# Patient Record
Sex: Female | Born: 1940 | Race: White | Hispanic: No | Smoking: Current every day smoker
Health system: Southern US, Community
[De-identification: ages and names within clinical notes are randomized; demographics above are authoritative.]

## PROBLEM LIST (undated history)

## (undated) DIAGNOSIS — C189 Malignant neoplasm of colon, unspecified: Secondary | ICD-10-CM

## (undated) DIAGNOSIS — E079 Disorder of thyroid, unspecified: Secondary | ICD-10-CM

## (undated) DIAGNOSIS — I10 Essential (primary) hypertension: Secondary | ICD-10-CM

## (undated) HISTORY — PX: COLON SURGERY: SHX602

## (undated) HISTORY — PX: ABDOMINAL HYSTERECTOMY: SHX81

## (undated) HISTORY — PX: BRAIN SURGERY: SHX531

---

## 2002-02-03 ENCOUNTER — Encounter: Payer: Self-pay | Admitting: Orthopedic Surgery

## 2002-02-03 ENCOUNTER — Encounter: Admission: RE | Admit: 2002-02-03 | Discharge: 2002-02-03 | Payer: Self-pay | Admitting: Orthopedic Surgery

## 2008-02-04 ENCOUNTER — Emergency Department (HOSPITAL_COMMUNITY): Admission: EM | Admit: 2008-02-04 | Discharge: 2008-02-04 | Payer: Self-pay | Admitting: Emergency Medicine

## 2008-07-29 ENCOUNTER — Encounter: Admission: RE | Admit: 2008-07-29 | Discharge: 2008-07-29 | Payer: Self-pay | Admitting: Family Medicine

## 2008-08-30 ENCOUNTER — Encounter: Admission: RE | Admit: 2008-08-30 | Discharge: 2008-08-30 | Payer: Self-pay | Admitting: Gastroenterology

## 2008-10-07 ENCOUNTER — Encounter: Admission: RE | Admit: 2008-10-07 | Discharge: 2008-10-07 | Payer: Self-pay | Admitting: Gastroenterology

## 2009-04-05 ENCOUNTER — Emergency Department (HOSPITAL_COMMUNITY): Admission: EM | Admit: 2009-04-05 | Discharge: 2009-04-05 | Payer: Self-pay | Admitting: Emergency Medicine

## 2009-07-05 ENCOUNTER — Emergency Department (HOSPITAL_COMMUNITY): Admission: EM | Admit: 2009-07-05 | Discharge: 2009-07-05 | Payer: Self-pay | Admitting: Emergency Medicine

## 2009-07-10 ENCOUNTER — Encounter: Admission: RE | Admit: 2009-07-10 | Discharge: 2009-07-10 | Payer: Self-pay | Admitting: Gastroenterology

## 2009-07-21 ENCOUNTER — Ambulatory Visit (HOSPITAL_COMMUNITY): Admission: RE | Admit: 2009-07-21 | Discharge: 2009-07-21 | Payer: Self-pay | Admitting: Gastroenterology

## 2009-07-21 ENCOUNTER — Encounter (INDEPENDENT_AMBULATORY_CARE_PROVIDER_SITE_OTHER): Payer: Self-pay | Admitting: Gastroenterology

## 2010-05-07 ENCOUNTER — Inpatient Hospital Stay (HOSPITAL_COMMUNITY): Admission: EM | Admit: 2010-05-07 | Discharge: 2010-05-09 | Payer: Self-pay | Admitting: Emergency Medicine

## 2010-05-08 ENCOUNTER — Ambulatory Visit: Payer: Self-pay | Admitting: Psychiatry

## 2010-05-09 ENCOUNTER — Ambulatory Visit: Payer: Self-pay | Admitting: Psychiatry

## 2010-06-04 IMAGING — RF DG BE THRU COLOSTOMY
17 series · 17 of 17 positions shown · IV contrast (agent unspecified)
Comparison: CT 08/30/2008

CLINICAL DATA: Rectal cancer, with prior partial colectomy.
Question anatomy.

SINGLE CONTRAST BARIUM ENEMA
Fluoroscopy Time: 2.9 minutes.
Contrast: Thin barium.

[Series 1: run · 1 of 1 slices shown (1 of 16)]
[im 1/1]
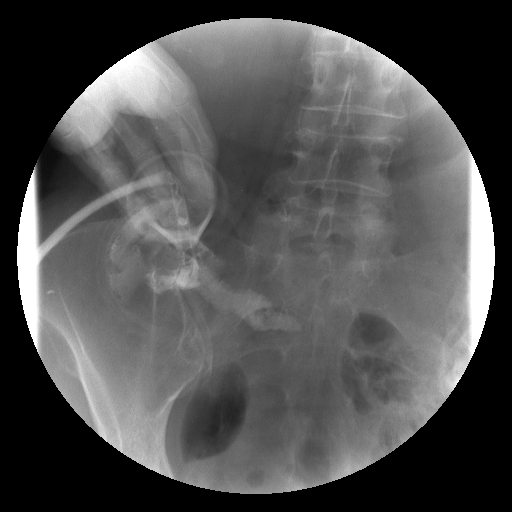

[Series 2: run · 1 of 1 slices shown (2 of 16)]
[im 1/1]
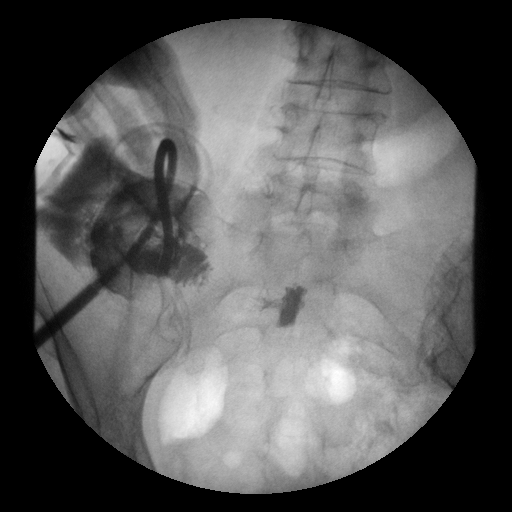

[Series 3: run · 1 of 1 slices shown (3 of 16)]
[im 1/1]
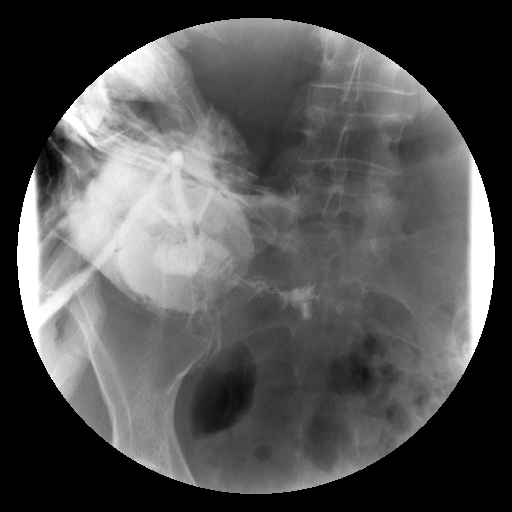

[Series 4: run · 1 of 1 slices shown (4 of 16)]
[im 1/1]
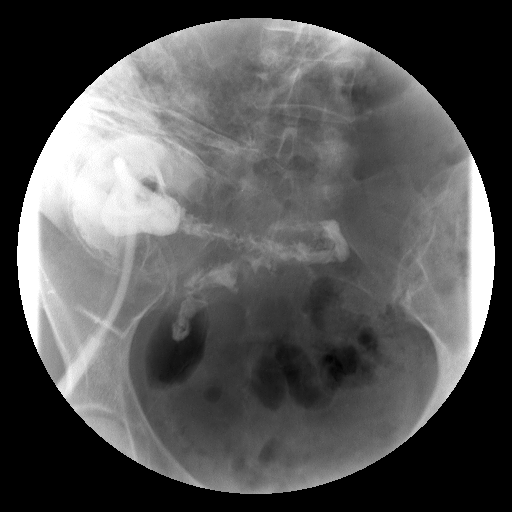

[Series 5: run · 1 of 1 slices shown (5 of 16)]
[im 1/1]
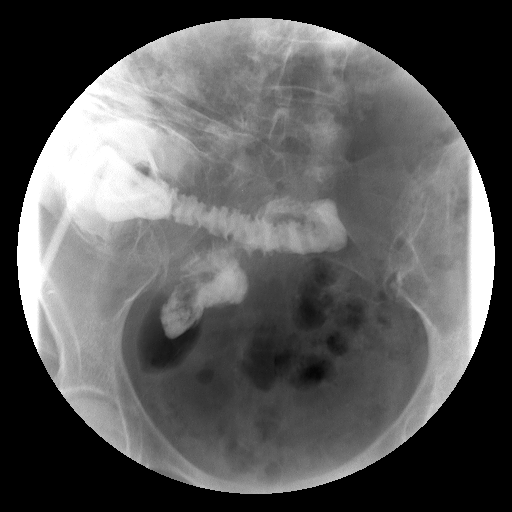

[Series 6: run · 1 of 1 slices shown (6 of 16)]
[im 1/1]
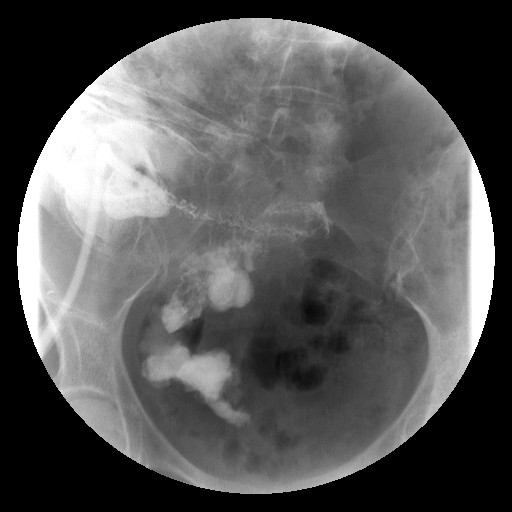

[Series 7: run · 1 of 1 slices shown (7 of 16)]
[im 1/1]
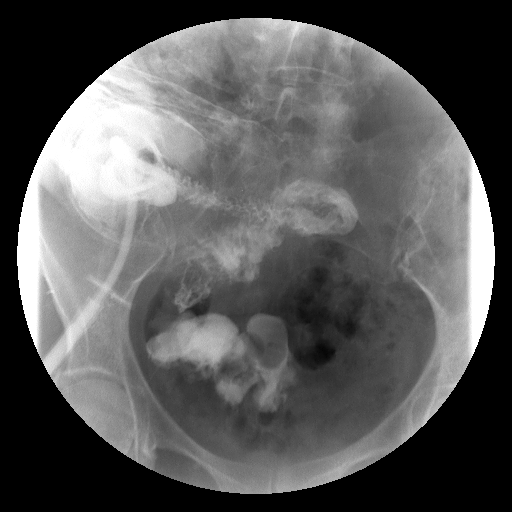

[Series 8: run · 1 of 1 slices shown (8 of 16)]
[im 1/1]
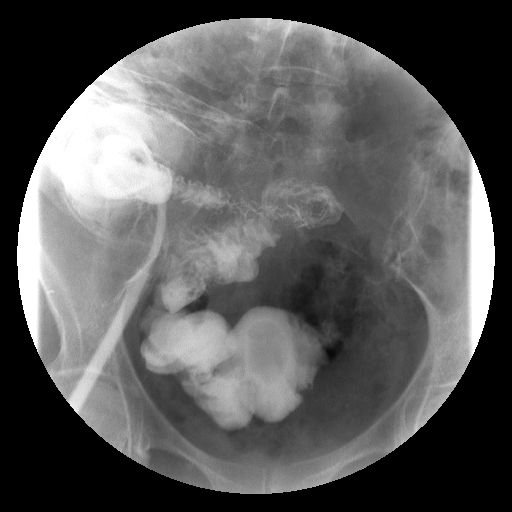

[Series 9: run · 1 of 1 slices shown (9 of 16)]
[im 1/1]
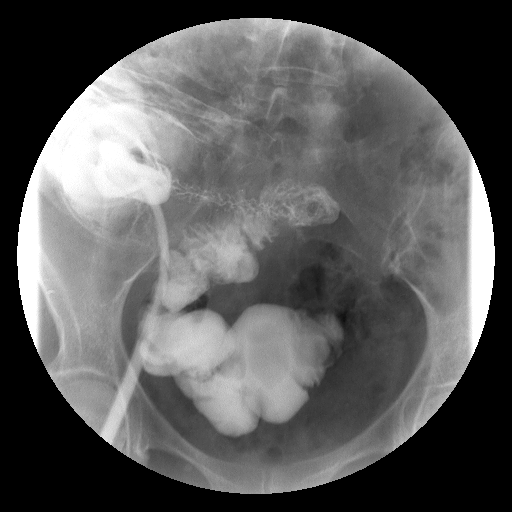

[Series 10: run · 1 of 1 slices shown (10 of 16)]
[im 1/1]
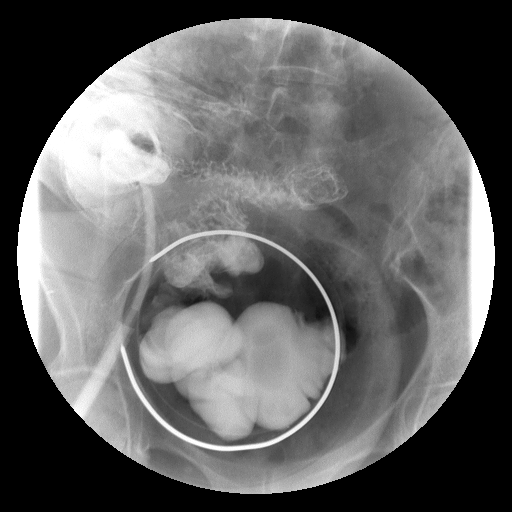

[Series 11: run · 1 of 1 slices shown (11 of 16)]
[im 1/1]
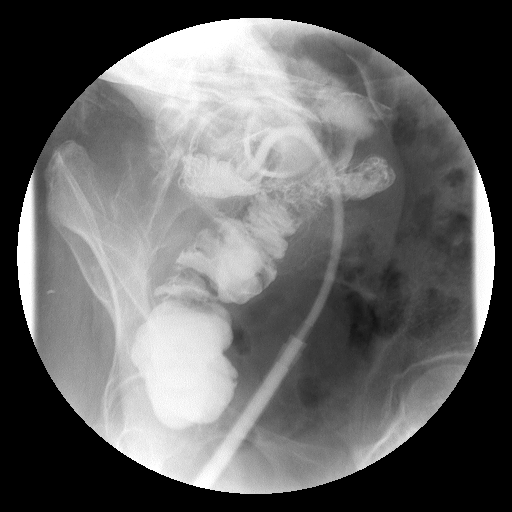

[Series 12: run · 1 of 1 slices shown (12 of 16)]
[im 1/1]
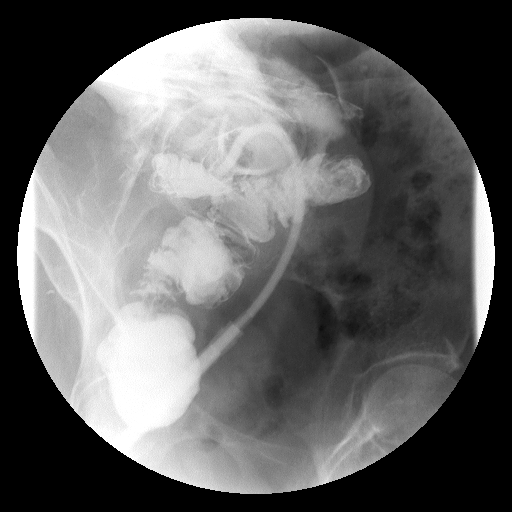

[Series 13: run · 1 of 1 slices shown (13 of 16)]
[im 1/1]
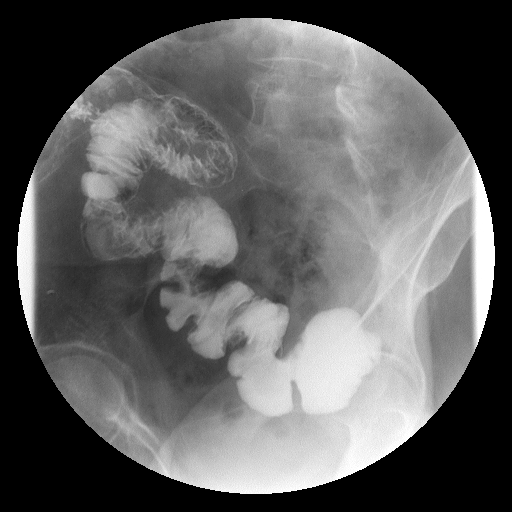

[Series 14: run · 1 of 1 slices shown (14 of 16)]
[im 1/1]
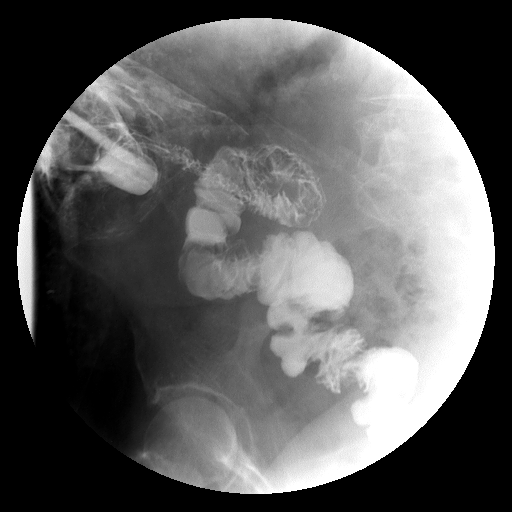

[Series 15: run · 1 of 1 slices shown (15 of 16)]
[im 1/1]
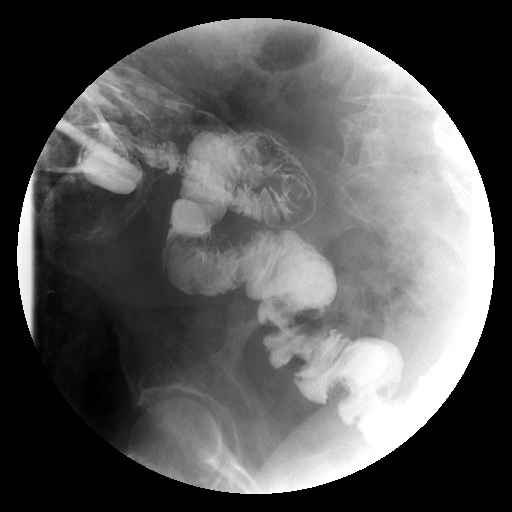

[Series 16: run · 1 of 1 slices shown (16 of 16)]
[im 1/1]
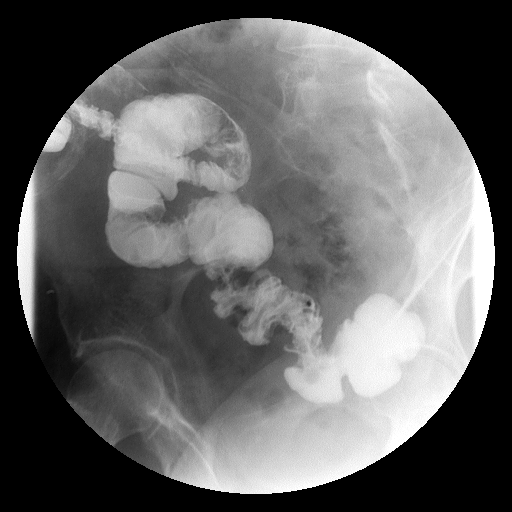

[Series 1001: view not recorded · 0.20mm/px · 1 of 1 slices shown]
[im 1/1]
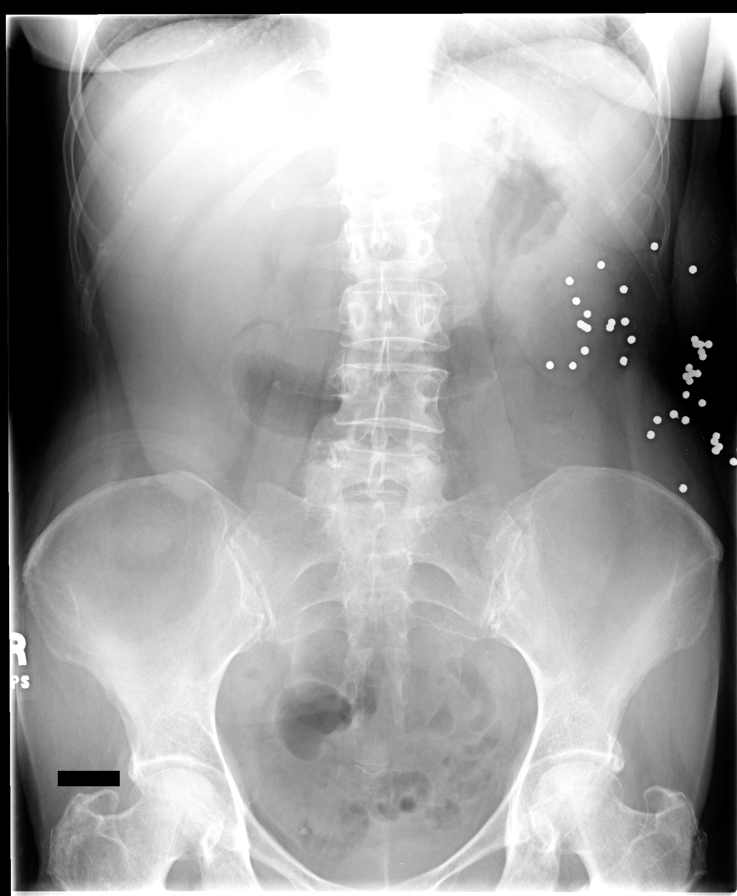

[17 of 17 positions shown; findings below may reference images not displayed]

FINDINGS: Single contrast barium enema was performed through the
patient's right lower quadrant colostomy.  Scout image of the
abdomen shows normal bowel gas pattern. Enema through the colostomy
shows filling of the right side of the colon.  Colostomy at the
level of the proximal to mid transverse colon.  There is acute
angulation of the colon just to the left of midline, swinging back
upon itself approximate 180 degrees.  No obvious filling defect or
mass.
IMPRESSION: Acute angulation of the colon to the left of the midline of
approximately 180 degrees.  No definite filling defect or mass.

## 2010-06-12 ENCOUNTER — Encounter: Admission: RE | Admit: 2010-06-12 | Discharge: 2010-06-12 | Payer: Self-pay | Admitting: Gastroenterology

## 2010-11-18 ENCOUNTER — Encounter: Payer: Self-pay | Admitting: Surgery

## 2011-01-13 LAB — DIFFERENTIAL
Basophils Relative: 1 % (ref 0–1)
Eosinophils Absolute: 0.1 10*3/uL (ref 0.0–0.7)
Eosinophils Relative: 1 % (ref 0–5)
Lymphocytes Relative: 20 % (ref 12–46)
Lymphs Abs: 1.5 10*3/uL (ref 0.7–4.0)
Monocytes Absolute: 0.2 10*3/uL (ref 0.1–1.0)
Monocytes Relative: 3 % (ref 3–12)
Neutro Abs: 5.5 10*3/uL (ref 1.7–7.7)
Neutro Abs: 5.6 10*3/uL (ref 1.7–7.7)
Neutrophils Relative %: 76 % (ref 43–77)

## 2011-01-13 LAB — RAPID URINE DRUG SCREEN, HOSP PERFORMED
Cocaine: NOT DETECTED
Opiates: POSITIVE — AB
Tetrahydrocannabinol: NOT DETECTED

## 2011-01-13 LAB — CBC
HCT: 37.3 % (ref 36.0–46.0)
Hemoglobin: 10.4 g/dL — ABNORMAL LOW (ref 12.0–15.0)
Hemoglobin: 11.8 g/dL — ABNORMAL LOW (ref 12.0–15.0)
Hemoglobin: 12.9 g/dL (ref 12.0–15.0)
MCH: 33.3 pg (ref 26.0–34.0)
MCHC: 34.9 g/dL (ref 30.0–36.0)
MCV: 95.5 fL (ref 78.0–100.0)
MCV: 95.8 fL (ref 78.0–100.0)
MCV: 96.4 fL (ref 78.0–100.0)
Platelets: 144 10*3/uL — ABNORMAL LOW (ref 150–400)
Platelets: 173 10*3/uL (ref 150–400)
Platelets: 191 10*3/uL (ref 150–400)
RBC: 3.13 MIL/uL — ABNORMAL LOW (ref 3.87–5.11)
RBC: 3.89 MIL/uL (ref 3.87–5.11)
RDW: 13.3 % (ref 11.5–15.5)
WBC: 5.9 10*3/uL (ref 4.0–10.5)

## 2011-01-13 LAB — METHYLMALONIC ACID, SERUM: Methylmalonic Acid, Quantitative: 186 nmol/L (ref 87–318)

## 2011-01-13 LAB — COMPREHENSIVE METABOLIC PANEL
ALT: 35 U/L (ref 0–35)
Albumin: 3.2 g/dL — ABNORMAL LOW (ref 3.5–5.2)
Albumin: 3.8 g/dL (ref 3.5–5.2)
Alkaline Phosphatase: 97 U/L (ref 39–117)
BUN: 12 mg/dL (ref 6–23)
BUN: 19 mg/dL (ref 6–23)
CO2: 26 mEq/L (ref 19–32)
Calcium: 8.2 mg/dL — ABNORMAL LOW (ref 8.4–10.5)
Calcium: 9.3 mg/dL (ref 8.4–10.5)
Chloride: 105 mEq/L (ref 96–112)
GFR calc Af Amer: >60 mL/min (ref >60)
GFR calc non Af Amer: >60 mL/min (ref >60)
Glucose, Bld: 86 mg/dL (ref 70–99)
Glucose, Bld: 95 mg/dL (ref 70–99)
Potassium: 3.6 mEq/L (ref 3.5–5.1)
Sodium: 138 mEq/L (ref 135–145)
Sodium: 139 mEq/L (ref 135–145)
Total Protein: 5.7 g/dL — ABNORMAL LOW (ref 6.0–8.3)
Total Protein: 6.9 g/dL (ref 6.0–8.3)

## 2011-01-13 LAB — CARDIAC PANEL(CRET KIN+CKTOT+MB+TROPI)
Relative Index: 1.8 (ref 0.0–2.5)
Relative Index: 2 (ref 0.0–2.5)
Relative Index: 2 (ref 0.0–2.5)
Total CK: 167 U/L (ref 7–177)
Troponin I: 0.02 ng/mL (ref 0.00–0.06)
Troponin I: 0.02 ng/mL (ref 0.00–0.06)
Troponin I: 0.02 ng/mL (ref 0.00–0.06)

## 2011-01-13 LAB — FERRITIN: Ferritin: 125 ng/mL (ref 10–291)

## 2011-01-13 LAB — HOMOCYSTEINE: Homocysteine: 4.8 umol/L (ref 4.0–15.4)

## 2011-01-13 LAB — ACETAMINOPHEN LEVEL
Acetaminophen (Tylenol), Serum: <10 ug/mL — ABNORMAL LOW (ref 10–30)
Acetaminophen (Tylenol), Serum: <10 ug/mL — ABNORMAL LOW (ref 10–30)

## 2011-01-13 LAB — INTRINSIC FACTOR ANTIBODIES: Intrinsic Factor: NEGATIVE

## 2011-01-13 LAB — IRON AND TIBC
Saturation Ratios: 32 % (ref 20–55)
TIBC: 255 ug/dL (ref 250–470)
UIBC: 173 ug/dL

## 2011-01-13 LAB — URINALYSIS, ROUTINE W REFLEX MICROSCOPIC
Bilirubin Urine: NEGATIVE
Glucose, UA: NEGATIVE mg/dL
Protein, ur: NEGATIVE mg/dL
Specific Gravity, Urine: 1.022 (ref 1.005–1.030)
Urobilinogen, UA: 0.2 mg/dL (ref 0.0–1.0)

## 2011-01-13 LAB — TSH: TSH: 1.639 u[IU]/mL (ref 0.350–4.500)

## 2011-01-13 LAB — PROTIME-INR
INR: 1.04 (ref 0.00–1.49)
Prothrombin Time: 13.5 seconds (ref 11.6–15.2)

## 2011-01-13 LAB — GLUCOSE, CAPILLARY: Glucose-Capillary: 83 mg/dL (ref 70–99)

## 2011-01-13 LAB — FOLATE: Folate: >20 ng/mL

## 2011-01-13 LAB — BASIC METABOLIC PANEL
Chloride: 115 mEq/L — ABNORMAL HIGH (ref 96–112)
Creatinine, Ser: 0.67 mg/dL (ref 0.4–1.2)
GFR calc Af Amer: >60 mL/min (ref >60)
Sodium: 140 mEq/L (ref 135–145)

## 2011-01-13 LAB — POCT CARDIAC MARKERS: Myoglobin, poc: 218 ng/mL (ref 12–200)

## 2011-01-13 LAB — PHOSPHORUS: Phosphorus: 2.8 mg/dL (ref 2.3–4.6)

## 2011-01-13 LAB — MRSA PCR SCREENING: MRSA by PCR: NEGATIVE

## 2011-01-13 LAB — APTT: aPTT: 28 seconds (ref 24–37)

## 2011-01-13 LAB — ETHANOL: Alcohol, Ethyl (B): <5 mg/dL (ref 0–10)

## 2011-01-13 LAB — URINE CULTURE

## 2011-02-01 LAB — COMPREHENSIVE METABOLIC PANEL
ALT: 19 U/L (ref 0–35)
AST: 21 U/L (ref 0–37)
Albumin: 4.1 g/dL (ref 3.5–5.2)
Calcium: 9.9 mg/dL (ref 8.4–10.5)
GFR calc Af Amer: >60 mL/min (ref >60)
Sodium: 144 mEq/L (ref 135–145)
Total Protein: 7.5 g/dL (ref 6.0–8.3)

## 2011-02-01 LAB — DIFFERENTIAL
Basophils Absolute: 0 10*3/uL (ref 0.0–0.1)
Basophils Relative: 0 % (ref 0–1)
Lymphocytes Relative: 11 % — ABNORMAL LOW (ref 12–46)
Neutro Abs: 10.5 10*3/uL — ABNORMAL HIGH (ref 1.7–7.7)
Neutrophils Relative %: 85 % — ABNORMAL HIGH (ref 43–77)

## 2011-02-01 LAB — LIPASE, BLOOD: Lipase: 17 U/L (ref 11–59)

## 2011-02-01 LAB — GASTRIC OCCULT BLOOD (1-CARD TO LAB): Occult Blood, Gastric: NEGATIVE

## 2011-02-01 LAB — URINALYSIS, ROUTINE W REFLEX MICROSCOPIC
Ketones, ur: NEGATIVE mg/dL
Nitrite: NEGATIVE
Protein, ur: NEGATIVE mg/dL
Urobilinogen, UA: 1 mg/dL (ref 0.0–1.0)
pH: 5 (ref 5.0–8.0)

## 2011-02-01 LAB — CBC
MCHC: 34 g/dL (ref 30.0–36.0)
RBC: 4.66 MIL/uL (ref 3.87–5.11)
RDW: 13.7 % (ref 11.5–15.5)

## 2011-02-01 LAB — URINE MICROSCOPIC-ADD ON

## 2011-02-01 LAB — LACTIC ACID, PLASMA: Lactic Acid, Venous: 2.6 mmol/L — ABNORMAL HIGH (ref 0.5–2.2)

## 2011-02-04 LAB — PROTIME-INR: INR: 0.9 (ref 0.00–1.49)

## 2011-02-04 LAB — COMPREHENSIVE METABOLIC PANEL
Alkaline Phosphatase: 59 U/L (ref 39–117)
BUN: 9 mg/dL (ref 6–23)
CO2: 26 mEq/L (ref 19–32)
Chloride: 107 mEq/L (ref 96–112)
Creatinine, Ser: 0.85 mg/dL (ref 0.4–1.2)
GFR calc non Af Amer: >60 mL/min (ref >60)
Glucose, Bld: 90 mg/dL (ref 70–99)
Total Bilirubin: 0.4 mg/dL (ref 0.3–1.2)

## 2011-02-04 LAB — D-DIMER, QUANTITATIVE: D-Dimer, Quant: <0.22 ug/mL-FEU (ref 0.00–0.48)

## 2011-02-04 LAB — CBC
HCT: 34.3 % — ABNORMAL LOW (ref 36.0–46.0)
Hemoglobin: 11.9 g/dL — ABNORMAL LOW (ref 12.0–15.0)
MCV: 95.3 fL (ref 78.0–100.0)
RBC: 3.6 MIL/uL — ABNORMAL LOW (ref 3.87–5.11)
WBC: 5.9 10*3/uL (ref 4.0–10.5)

## 2011-02-04 LAB — DIFFERENTIAL
Basophils Absolute: 0 10*3/uL (ref 0.0–0.1)
Basophils Relative: 1 % (ref 0–1)
Lymphocytes Relative: 27 % (ref 12–46)
Neutro Abs: 3.8 10*3/uL (ref 1.7–7.7)

## 2011-02-04 LAB — APTT: aPTT: 28 seconds (ref 24–37)

## 2011-02-04 LAB — POCT CARDIAC MARKERS: Troponin i, poc: <0.05 ng/mL (ref 0.00–0.09)

## 2011-05-13 ENCOUNTER — Other Ambulatory Visit: Payer: Self-pay | Admitting: Orthopaedic Surgery

## 2011-05-13 DIAGNOSIS — I739 Peripheral vascular disease, unspecified: Secondary | ICD-10-CM

## 2011-05-20 ENCOUNTER — Ambulatory Visit
Admission: RE | Admit: 2011-05-20 | Discharge: 2011-05-20 | Disposition: A | Discharge status: Home / Self Care | Payer: PRIVATE HEALTH INSURANCE | Source: Ambulatory Visit | Attending: Orthopaedic Surgery | Admitting: Orthopaedic Surgery

## 2011-05-20 DIAGNOSIS — I739 Peripheral vascular disease, unspecified: Secondary | ICD-10-CM

## 2011-05-23 ENCOUNTER — Other Ambulatory Visit: Payer: Self-pay | Admitting: Orthopaedic Surgery

## 2011-05-23 DIAGNOSIS — I739 Peripheral vascular disease, unspecified: Secondary | ICD-10-CM

## 2011-05-27 ENCOUNTER — Ambulatory Visit
Admission: RE | Admit: 2011-05-27 | Discharge: 2011-05-27 | Disposition: A | Discharge status: Home / Self Care | Payer: PRIVATE HEALTH INSURANCE | Source: Ambulatory Visit | Attending: Orthopaedic Surgery | Admitting: Orthopaedic Surgery

## 2011-05-27 DIAGNOSIS — I739 Peripheral vascular disease, unspecified: Secondary | ICD-10-CM

## 2012-03-06 ENCOUNTER — Encounter (HOSPITAL_COMMUNITY): Payer: Self-pay | Admitting: *Deleted

## 2012-03-06 ENCOUNTER — Emergency Department (HOSPITAL_COMMUNITY): Payer: PRIVATE HEALTH INSURANCE

## 2012-03-06 ENCOUNTER — Emergency Department (HOSPITAL_COMMUNITY)
Admission: EM | Admit: 2012-03-06 | Discharge: 2012-03-07 | Disposition: A | Discharge status: Home / Self Care | Payer: PRIVATE HEALTH INSURANCE | Attending: Emergency Medicine | Admitting: Emergency Medicine

## 2012-03-06 DIAGNOSIS — R109 Unspecified abdominal pain: Secondary | ICD-10-CM | POA: Insufficient documentation

## 2012-03-06 DIAGNOSIS — Z933 Colostomy status: Secondary | ICD-10-CM | POA: Insufficient documentation

## 2012-03-06 DIAGNOSIS — R10819 Abdominal tenderness, unspecified site: Secondary | ICD-10-CM | POA: Insufficient documentation

## 2012-03-06 DIAGNOSIS — Z79899 Other long term (current) drug therapy: Secondary | ICD-10-CM | POA: Insufficient documentation

## 2012-03-06 DIAGNOSIS — Z85038 Personal history of other malignant neoplasm of large intestine: Secondary | ICD-10-CM | POA: Insufficient documentation

## 2012-03-06 HISTORY — DX: Disorder of thyroid, unspecified: E07.9

## 2012-03-06 HISTORY — DX: Essential (primary) hypertension: I10

## 2012-03-06 HISTORY — DX: Malignant neoplasm of colon, unspecified: C18.9

## 2012-03-06 LAB — COMPREHENSIVE METABOLIC PANEL
Albumin: 4.4 g/dL (ref 3.5–5.2)
Alkaline Phosphatase: 97 U/L (ref 39–117)
BUN: 7 mg/dL (ref 6–23)
CO2: 30 mEq/L (ref 19–32)
Chloride: 102 mEq/L (ref 96–112)
Creatinine, Ser: 0.68 mg/dL (ref 0.50–1.10)
GFR calc Af Amer: >90 mL/min (ref >90)
GFR calc non Af Amer: 87 mL/min — ABNORMAL LOW (ref >90)
Glucose, Bld: 106 mg/dL — ABNORMAL HIGH (ref 70–99)
Potassium: 4 mEq/L (ref 3.5–5.1)
Total Bilirubin: 0.7 mg/dL (ref 0.3–1.2)

## 2012-03-06 LAB — URINALYSIS, ROUTINE W REFLEX MICROSCOPIC
Ketones, ur: NEGATIVE mg/dL
Leukocytes, UA: NEGATIVE
Nitrite: NEGATIVE
Protein, ur: NEGATIVE mg/dL
Urobilinogen, UA: 0.2 mg/dL (ref 0.0–1.0)

## 2012-03-06 LAB — CBC
HCT: 40.4 % (ref 36.0–46.0)
Hemoglobin: 13.7 g/dL (ref 12.0–15.0)
MCH: 31.6 pg (ref 26.0–34.0)
MCHC: 33.9 g/dL (ref 30.0–36.0)
RBC: 4.34 MIL/uL (ref 3.87–5.11)

## 2012-03-06 LAB — DIFFERENTIAL
Basophils Relative: 1 % (ref 0–1)
Lymphs Abs: 1.7 10*3/uL (ref 0.7–4.0)
Monocytes Absolute: 0.2 10*3/uL (ref 0.1–1.0)
Monocytes Relative: 3 % (ref 3–12)
Neutro Abs: 5.1 10*3/uL (ref 1.7–7.7)

## 2012-03-06 MED ORDER — MORPHINE SULFATE 4 MG/ML IJ SOLN
4.0000 mg | Freq: Once | INTRAMUSCULAR | Status: AC
  Administered 2012-03-06: 4 mg via INTRAVENOUS
  Filled 2012-03-06: qty 1

## 2012-03-06 MED ORDER — ONDANSETRON HCL 4 MG/2ML IJ SOLN
4.0000 mg | Freq: Once | INTRAMUSCULAR | Status: AC
  Administered 2012-03-06: 4 mg via INTRAVENOUS
  Filled 2012-03-06: qty 2

## 2012-03-06 MED ORDER — IOHEXOL 300 MG/ML  SOLN: 80.0000 mL | Freq: Once | INTRAMUSCULAR | Status: AC | PRN

## 2012-03-06 MED ORDER — SODIUM CHLORIDE 0.9 % IV BOLUS (SEPSIS)
1000.0000 mL | Freq: Once | INTRAVENOUS | Status: AC
  Administered 2012-03-06: 1000 mL via INTRAVENOUS

## 2012-03-06 MED ORDER — SODIUM CHLORIDE 0.9 % IV SOLN: INTRAVENOUS | Status: DC

## 2012-03-06 NOTE — ED Notes (Signed)
Family is complaining about how long it is taking to get the results from the CT.  This nurse and the pt advocate spoke with patient and assured the MD would look at the results when they come back.  The daughter is the one upset not the paitient
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
## 2012-03-06 NOTE — ED Notes (Signed)
Pt reports right lower abdominal pain with radiation to right lower back starting a week ago and becoming progressively worse.  Pt denies V/D, but reports nausea prior to today.  Pt reports she has a history of colostomy bag on right side and is concerned something is wrong with site.  Pt has history of colon cancer and is currently in remission.

## 2012-03-07 MED ORDER — ONDANSETRON 8 MG PO TBDP: 8.0000 mg | ORAL_TABLET | Freq: Three times a day (TID) | ORAL | Status: AC | PRN

## 2012-03-07 NOTE — Discharge Instructions (Signed)
Drink plenty of fluids. Use the Phenergan and Zofran for your nausea so you can take your morphine for pain. Call Dr. Allene Pyo office on Monday to get an appointment to have him rechecked her colostomy. Your CT scan tonight does not show any problem with your colostomy. Your blood work was also normal. Return if you get fever, have uncontrolled vomiting or seem worse.

## 2012-06-16 ENCOUNTER — Other Ambulatory Visit: Payer: Self-pay | Admitting: Family Medicine

## 2012-06-16 DIAGNOSIS — Z780 Asymptomatic menopausal state: Secondary | ICD-10-CM

## 2012-06-16 DIAGNOSIS — Z1231 Encounter for screening mammogram for malignant neoplasm of breast: Secondary | ICD-10-CM

## 2012-07-02 ENCOUNTER — Ambulatory Visit
Admission: RE | Admit: 2012-07-02 | Discharge: 2012-07-02 | Disposition: A | Discharge status: Home / Self Care | Payer: Medicare Other | Source: Ambulatory Visit | Attending: Family Medicine | Admitting: Family Medicine

## 2012-07-02 DIAGNOSIS — Z1231 Encounter for screening mammogram for malignant neoplasm of breast: Secondary | ICD-10-CM

## 2012-07-02 DIAGNOSIS — Z780 Asymptomatic menopausal state: Secondary | ICD-10-CM

## 2012-07-03 ENCOUNTER — Other Ambulatory Visit: Payer: Self-pay | Admitting: Gastroenterology

## 2012-07-07 ENCOUNTER — Other Ambulatory Visit: Payer: Self-pay | Admitting: Family Medicine

## 2012-07-07 DIAGNOSIS — R928 Other abnormal and inconclusive findings on diagnostic imaging of breast: Secondary | ICD-10-CM

## 2012-07-08 ENCOUNTER — Ambulatory Visit
Admission: RE | Admit: 2012-07-08 | Discharge: 2012-07-08 | Disposition: A | Discharge status: Home / Self Care | Payer: Medicare Other | Source: Ambulatory Visit | Attending: Gastroenterology | Admitting: Gastroenterology

## 2012-07-13 ENCOUNTER — Ambulatory Visit
Admission: RE | Admit: 2012-07-13 | Discharge: 2012-07-13 | Disposition: A | Discharge status: Home / Self Care | Payer: Medicare Other | Source: Ambulatory Visit | Attending: Family Medicine | Admitting: Family Medicine

## 2012-07-13 DIAGNOSIS — R928 Other abnormal and inconclusive findings on diagnostic imaging of breast: Secondary | ICD-10-CM

## 2013-02-03 ENCOUNTER — Other Ambulatory Visit: Payer: Self-pay | Admitting: Family Medicine

## 2013-02-03 DIAGNOSIS — N6489 Other specified disorders of breast: Secondary | ICD-10-CM

## 2013-03-07 ENCOUNTER — Encounter (HOSPITAL_COMMUNITY): Payer: Self-pay | Admitting: Emergency Medicine

## 2013-03-07 ENCOUNTER — Emergency Department (HOSPITAL_COMMUNITY)
Admission: EM | Admit: 2013-03-07 | Discharge: 2013-03-07 | Disposition: A | Discharge status: Home / Self Care | Payer: Medicare Other | Attending: Emergency Medicine | Admitting: Emergency Medicine

## 2013-03-07 ENCOUNTER — Emergency Department (HOSPITAL_COMMUNITY): Payer: Medicare Other

## 2013-03-07 DIAGNOSIS — M12869 Other specific arthropathies, not elsewhere classified, unspecified knee: Secondary | ICD-10-CM | POA: Insufficient documentation

## 2013-03-07 DIAGNOSIS — G20A1 Parkinson's disease without dyskinesia, without mention of fluctuations: Secondary | ICD-10-CM | POA: Insufficient documentation

## 2013-03-07 DIAGNOSIS — Z79899 Other long term (current) drug therapy: Secondary | ICD-10-CM | POA: Insufficient documentation

## 2013-03-07 DIAGNOSIS — E079 Disorder of thyroid, unspecified: Secondary | ICD-10-CM | POA: Insufficient documentation

## 2013-03-07 DIAGNOSIS — M25562 Pain in left knee: Secondary | ICD-10-CM

## 2013-03-07 DIAGNOSIS — I10 Essential (primary) hypertension: Secondary | ICD-10-CM | POA: Insufficient documentation

## 2013-03-07 DIAGNOSIS — Z85038 Personal history of other malignant neoplasm of large intestine: Secondary | ICD-10-CM | POA: Insufficient documentation

## 2013-03-07 DIAGNOSIS — M25569 Pain in unspecified knee: Secondary | ICD-10-CM | POA: Insufficient documentation

## 2013-03-07 DIAGNOSIS — G20 Parkinson's disease: Secondary | ICD-10-CM | POA: Insufficient documentation

## 2013-03-07 LAB — CBC WITH DIFFERENTIAL/PLATELET
Basophils Absolute: 0.1 10*3/uL (ref 0.0–0.1)
Eosinophils Relative: 3 % (ref 0–5)
Lymphocytes Relative: 27 % (ref 12–46)
MCV: 93.4 fL (ref 78.0–100.0)
Neutro Abs: 5.3 10*3/uL (ref 1.7–7.7)
Neutrophils Relative %: 64 % (ref 43–77)
Platelets: 306 10*3/uL (ref 150–400)
RDW: 12.5 % (ref 11.5–15.5)
WBC: 8.3 10*3/uL (ref 4.0–10.5)

## 2013-03-07 LAB — POCT I-STAT, CHEM 8
BUN: 13 mg/dL (ref 6–23)
Potassium: 4 mEq/L (ref 3.5–5.1)
Sodium: 140 mEq/L (ref 135–145)
TCO2: 32 mmol/L (ref 0–100)

## 2013-03-07 LAB — APTT: aPTT: 40 seconds — ABNORMAL HIGH (ref 24–37)

## 2013-03-07 MED ORDER — HYDROMORPHONE HCL PF 1 MG/ML IJ SOLN
1.0000 mg | Freq: Once | INTRAMUSCULAR | Status: AC
  Administered 2013-03-07: 1 mg via INTRAVENOUS
  Filled 2013-03-07: qty 1

## 2013-03-07 MED ORDER — ONDANSETRON HCL 4 MG/2ML IJ SOLN
4.0000 mg | Freq: Once | INTRAMUSCULAR | Status: AC
  Administered 2013-03-07: 4 mg via INTRAVENOUS
  Filled 2013-03-07: qty 2

## 2013-03-07 NOTE — ED Notes (Signed)
Patient transported to X-ray 

## 2013-03-07 NOTE — ED Provider Notes (Signed)
History     CSN: [id number]  Arrival date & time 03/07/13  0135   First MD Initiated Contact with Patient 03/07/13 0208      Chief Complaint  Patient presents with  . leg pain, left     (Consider location/radiation/quality/duration/timing/severity/associated sxs/prior treatment) HPI Hx per PT - at home tonight sitting on the couch with onset of L knee pain. She has h/o chronic LBP and gets injections for the same. She takes percocet and morphine at home, she occasionally gets LLE pains and cramping but states never this severe. Sharp in quality and not radiating. No trauma, no swelling or rash.  No F/C. BIB EMS for hurts to move and PT worried that she could not walk.  Past Medical History  Diagnosis Date  . Colon cancer   . Hypertension   . Thyroid disease   . Parkinson's disease     Past Surgical History  Procedure Laterality Date  . Colon surgery    . Abdominal hysterectomy    . Brain surgery      No family history on file.  History  Substance Use Topics  . Smoking status: Current Every Day Smoker  . Smokeless tobacco: Not on file  . Alcohol Use: Yes     Comment: intermittent    OB History   Grav Para Term Preterm Abortions TAB SAB Ect Mult Living                  Review of Systems  Constitutional: Negative for fever and chills.  HENT: Negative for neck pain and neck stiffness.   Eyes: Negative for pain.  Respiratory: Negative for shortness of breath.   Cardiovascular: Negative for chest pain and leg swelling.  Gastrointestinal: Negative for abdominal pain.  Genitourinary: Negative for dysuria.  Musculoskeletal: Negative for joint swelling.  Skin: Negative for rash and wound.  Neurological: Negative for headaches.  All other systems reviewed and are negative.    Allergies  Review of patient's allergies indicates no known allergies.  Home Medications   Current Outpatient Rx  Name  Route  Sig  Dispense  Refill  . atorvastatin (LIPITOR) 10 MG  tablet   Oral   Take 10 mg by mouth at bedtime.         . cholecalciferol (VITAMIN D) 1000 UNITS tablet   Oral   Take 1,000 Units by mouth daily.         Marland Kitchen esomeprazole (NEXIUM) 40 MG capsule   Oral   Take 40 mg by mouth daily before breakfast.         . levothyroxine (SYNTHROID, LEVOTHROID) 75 MCG tablet   Oral   Take 75 mcg by mouth daily.         . montelukast (SINGULAIR) 10 MG tablet   Oral   Take 10 mg by mouth at bedtime.         Marland Kitchen morphine (MS CONTIN) 100 MG 12 hr tablet   Oral   Take 100 mg by mouth daily.         Marland Kitchen oxyCODONE-acetaminophen (PERCOCET) 10-325 MG per tablet   Oral   Take 1 tablet by mouth 4 (four) times daily. USED FOR PAIN           BP 135/66  Pulse 75  Temp(Src) 97.9 F (36.6 C) (Oral)  Resp 18  SpO2 96%  Physical Exam  Constitutional: She is oriented to person, place, and time. She appears well-developed and well-nourished.  HENT:  Head: Normocephalic  and atraumatic.  Eyes: EOM are normal. Pupils are equal, round, and reactive to light.  Neck: Neck supple.  Cardiovascular: Normal rate, regular rhythm and intact distal pulses.   Pulmonary/Chest: Effort normal and breath sounds normal. No respiratory distress.  Abdominal: Soft. Bowel sounds are normal. She exhibits no distension.  Musculoskeletal: She exhibits no edema.  LLE tenderness over patella, no knee swelling/ effusion or erythema/ inc warmth to touch. Dec ROM 2/2 pain. Distal N/V intact equal pulses. Pelvis stable no lumbar tenderness or deformity  Neurological: She is alert and oriented to person, place, and time.  Skin: Skin is warm and dry.    ED Course  Procedures (including critical care time)  Labs Reviewed  APTT - Abnormal; Notable for the following:    aPTT 40 (*)    All other components within normal limits  CBC WITH DIFFERENTIAL - Abnormal; Notable for the following:    RBC 3.80 (*)    HCT 35.5 (*)    All other components within normal limits  POCT  I-STAT, CHEM 8 - Abnormal; Notable for the following:    Glucose, Bld 104 (*)    All other components within normal limits   Dg Knee Complete 4 Views Left  03/07/2013  *RADIOLOGY REPORT*  Clinical Data: Acute onset severe knee pain without injury.  LEFT KNEE - COMPLETE 4+ VIEW  Comparison: None.  Findings: No fracture or dislocation is present.  No joint effusion.  Mild degenerative change is present about the lateral compartment.  IMPRESSION: Negative exam.   Original Report Authenticated By: Holley Dexter, M.D.      1. Left anterior knee pain     IV Dilaudid, imaging, ambulates to bathroom with assistance. Knee sleeve provided and plan f/u pain clinic tomorrow as scheduled. PCP referral. Continue home medications as prescribed.  Return precautions provided.  MDM  L knee pain, some arthritis present on xray as above. Labs sent by triage nurse reviewed as above. Improved with ice and IV narcotics. VS and nursing notes reviewed.         Sunnie Nielsen, MD 03/07/13 [phone]

## 2013-03-07 NOTE — ED Notes (Signed)
Pt. Has colostomy on RLQ, functional.

## 2013-03-07 NOTE — ED Notes (Addendum)
Per [facility] ,pt. From home with complaint of left leg pain at 10/10. Pt. Stated that she was  just on her recliner  And felt sudden pain on said leg around 11pm last night , took pain medicine but no relief.  Alert and oriented x 3. Denies fall. No SOB.

## 2013-03-07 NOTE — ED Notes (Signed)
ZOX:[id number]<AV> Expected date:03/07/13<BR> Expected time: 1:25 AM<BR> Means of arrival:Ambulance<BR> Comments:<BR> Leg pain

## 2013-09-28 ENCOUNTER — Other Ambulatory Visit: Payer: Self-pay | Admitting: Family Medicine

## 2013-10-25 ENCOUNTER — Other Ambulatory Visit: Payer: Self-pay | Admitting: Family Medicine

## 2014-07-20 ENCOUNTER — Other Ambulatory Visit: Payer: Self-pay | Admitting: Family Medicine

## 2015-11-15 ENCOUNTER — Ambulatory Visit: Payer: Medicare Other | Admitting: Neurology

## 2015-11-20 ENCOUNTER — Inpatient Hospital Stay (HOSPITAL_COMMUNITY)
Admission: EM | Admit: 2015-11-20 | Discharge: 2015-11-23 | DRG: 282 | Disposition: A | Discharge status: Home / Self Care | Payer: Medicare Other | Attending: Internal Medicine | Admitting: Internal Medicine

## 2015-11-20 ENCOUNTER — Emergency Department (HOSPITAL_COMMUNITY): Payer: Medicare Other

## 2015-11-20 ENCOUNTER — Encounter (HOSPITAL_COMMUNITY): Payer: Self-pay | Admitting: Emergency Medicine

## 2015-11-20 DIAGNOSIS — F17200 Nicotine dependence, unspecified, uncomplicated: Secondary | ICD-10-CM | POA: Diagnosis present

## 2015-11-20 DIAGNOSIS — Z85038 Personal history of other malignant neoplasm of large intestine: Secondary | ICD-10-CM

## 2015-11-20 DIAGNOSIS — R109 Unspecified abdominal pain: Secondary | ICD-10-CM

## 2015-11-20 DIAGNOSIS — E785 Hyperlipidemia, unspecified: Secondary | ICD-10-CM | POA: Diagnosis present

## 2015-11-20 DIAGNOSIS — I201 Angina pectoris with documented spasm: Secondary | ICD-10-CM | POA: Diagnosis present

## 2015-11-20 DIAGNOSIS — I214 Non-ST elevation (NSTEMI) myocardial infarction: Secondary | ICD-10-CM | POA: Diagnosis not present

## 2015-11-20 DIAGNOSIS — I10 Essential (primary) hypertension: Secondary | ICD-10-CM | POA: Diagnosis present

## 2015-11-20 DIAGNOSIS — R52 Pain, unspecified: Secondary | ICD-10-CM

## 2015-11-20 DIAGNOSIS — I119 Hypertensive heart disease without heart failure: Secondary | ICD-10-CM | POA: Diagnosis not present

## 2015-11-20 DIAGNOSIS — Z933 Colostomy status: Secondary | ICD-10-CM

## 2015-11-20 DIAGNOSIS — R7989 Other specified abnormal findings of blood chemistry: Secondary | ICD-10-CM

## 2015-11-20 DIAGNOSIS — T40605A Adverse effect of unspecified narcotics, initial encounter: Secondary | ICD-10-CM | POA: Diagnosis not present

## 2015-11-20 DIAGNOSIS — R410 Disorientation, unspecified: Secondary | ICD-10-CM | POA: Diagnosis not present

## 2015-11-20 DIAGNOSIS — R778 Other specified abnormalities of plasma proteins: Secondary | ICD-10-CM

## 2015-11-20 DIAGNOSIS — G20 Parkinson's disease: Secondary | ICD-10-CM | POA: Diagnosis present

## 2015-11-20 DIAGNOSIS — E039 Hypothyroidism, unspecified: Secondary | ICD-10-CM | POA: Diagnosis present

## 2015-11-20 DIAGNOSIS — R079 Chest pain, unspecified: Secondary | ICD-10-CM | POA: Diagnosis not present

## 2015-11-20 DIAGNOSIS — K5903 Drug induced constipation: Secondary | ICD-10-CM | POA: Diagnosis not present

## 2015-11-20 LAB — COMPREHENSIVE METABOLIC PANEL
ALT: 12 U/L — ABNORMAL LOW (ref 14–54)
ANION GAP: 15 (ref 5–15)
AST: 16 U/L (ref 15–41)
Albumin: 4.4 g/dL (ref 3.5–5.0)
Alkaline Phosphatase: 71 U/L (ref 38–126)
BUN: 7 mg/dL (ref 6–20)
CO2: 25 mmol/L (ref 22–32)
Calcium: 10.3 mg/dL (ref 8.9–10.3)
Chloride: 103 mmol/L (ref 101–111)
Creatinine, Ser: 0.71 mg/dL (ref 0.44–1.00)
GFR calc non Af Amer: >60 mL/min (ref >60)
GLUCOSE: 95 mg/dL (ref 65–99)
POTASSIUM: 4 mmol/L (ref 3.5–5.1)
Sodium: 143 mmol/L (ref 135–145)
Total Bilirubin: 1 mg/dL (ref 0.3–1.2)
Total Protein: 7 g/dL (ref 6.5–8.1)

## 2015-11-20 LAB — I-STAT TROPONIN, ED
TROPONIN I, POC: 0.16 ng/mL — AB (ref 0.00–0.08)
Troponin i, poc: 0.13 ng/mL (ref 0.00–0.08)

## 2015-11-20 LAB — RAPID URINE DRUG SCREEN, HOSP PERFORMED
Amphetamines: POSITIVE — AB
BARBITURATES: NOT DETECTED
BENZODIAZEPINES: NOT DETECTED
COCAINE: NOT DETECTED
OPIATES: NOT DETECTED
Tetrahydrocannabinol: NOT DETECTED

## 2015-11-20 LAB — CBC
HCT: 41.4 % (ref 36.0–46.0)
HEMOGLOBIN: 13.8 g/dL (ref 12.0–15.0)
MCH: 31.9 pg (ref 26.0–34.0)
MCHC: 33.3 g/dL (ref 30.0–36.0)
MCV: 95.8 fL (ref 78.0–100.0)
PLATELETS: 237 10*3/uL (ref 150–400)
RBC: 4.32 MIL/uL (ref 3.87–5.11)
RDW: 12.7 % (ref 11.5–15.5)
WBC: 9.5 10*3/uL (ref 4.0–10.5)

## 2015-11-20 LAB — TROPONIN I
TROPONIN I: 0.13 ng/mL — AB (ref <0.031)
Troponin I: 0.14 ng/mL — ABNORMAL HIGH (ref <0.031)

## 2015-11-20 LAB — LIPASE, BLOOD: LIPASE: 23 U/L (ref 11–51)

## 2015-11-20 LAB — MAGNESIUM: MAGNESIUM: 2 mg/dL (ref 1.7–2.4)

## 2015-11-20 LAB — PHOSPHORUS: Phosphorus: 3.8 mg/dL (ref 2.5–4.6)

## 2015-11-20 MED ORDER — METOPROLOL TARTRATE 25 MG PO TABS
25.0000 mg | ORAL_TABLET | Freq: Two times a day (BID) | ORAL | Status: DC
  Administered 2015-11-20 – 2015-11-21 (×2): 25 mg via ORAL
  Filled 2015-11-20 (×2): qty 1

## 2015-11-20 MED ORDER — ACETAMINOPHEN 325 MG PO TABS
650.0000 mg | ORAL_TABLET | ORAL | Status: DC | PRN
  Administered 2015-11-21: 650 mg via ORAL
  Filled 2015-11-20: qty 2

## 2015-11-20 MED ORDER — ASPIRIN EC 81 MG PO TBEC
81.0000 mg | DELAYED_RELEASE_TABLET | Freq: Every day | ORAL | Status: DC
  Administered 2015-11-21 – 2015-11-23 (×3): 81 mg via ORAL
  Filled 2015-11-20 (×3): qty 1

## 2015-11-20 MED ORDER — SODIUM CHLORIDE 0.9 % IJ SOLN: 3.0000 mL | INTRAMUSCULAR | Status: DC | PRN

## 2015-11-20 MED ORDER — ZOLPIDEM TARTRATE 5 MG PO TABS
5.0000 mg | ORAL_TABLET | Freq: Once | ORAL | Status: AC
  Administered 2015-11-20: 5 mg via ORAL
  Filled 2015-11-20: qty 1

## 2015-11-20 MED ORDER — SODIUM CHLORIDE 0.9 % IV SOLN: 250.0000 mL | INTRAVENOUS | Status: DC | PRN

## 2015-11-20 MED ORDER — LEVALBUTEROL HCL 0.63 MG/3ML IN NEBU: 0.6300 mg | INHALATION_SOLUTION | RESPIRATORY_TRACT | Status: DC | PRN

## 2015-11-20 MED ORDER — HEPARIN (PORCINE) IN NACL 100-0.45 UNIT/ML-% IJ SOLN
750.0000 [IU]/h | INTRAMUSCULAR | Status: DC
  Administered 2015-11-21: 600 [IU]/h via INTRAVENOUS
  Administered 2015-11-22: 750 [IU]/h via INTRAVENOUS
  Filled 2015-11-20 (×2): qty 250

## 2015-11-20 MED ORDER — ONDANSETRON HCL 4 MG/2ML IJ SOLN
4.0000 mg | Freq: Four times a day (QID) | INTRAMUSCULAR | Status: DC | PRN
Start: 2015-11-20 — End: 2015-11-23
  Administered 2015-11-20 – 2015-11-21 (×2): 4 mg via INTRAVENOUS
  Filled 2015-11-20 (×2): qty 2

## 2015-11-20 MED ORDER — NITROGLYCERIN 0.4 MG SL SUBL: 0.4000 mg | SUBLINGUAL_TABLET | SUBLINGUAL | Status: DC | PRN

## 2015-11-20 MED ORDER — ONDANSETRON HCL 4 MG/2ML IJ SOLN
4.0000 mg | Freq: Once | INTRAMUSCULAR | Status: AC
  Administered 2015-11-20: 4 mg via INTRAVENOUS
  Filled 2015-11-20: qty 2

## 2015-11-20 MED ORDER — ASPIRIN 81 MG PO CHEW: 324.0000 mg | CHEWABLE_TABLET | ORAL | Status: AC

## 2015-11-20 MED ORDER — PANTOPRAZOLE SODIUM 40 MG PO TBEC
40.0000 mg | DELAYED_RELEASE_TABLET | Freq: Every day | ORAL | Status: DC
  Administered 2015-11-21 – 2015-11-23 (×3): 40 mg via ORAL
  Filled 2015-11-20 (×3): qty 1

## 2015-11-20 MED ORDER — ASPIRIN 300 MG RE SUPP: 300.0000 mg | RECTAL | Status: AC

## 2015-11-20 MED ORDER — ATORVASTATIN CALCIUM 40 MG PO TABS
40.0000 mg | ORAL_TABLET | Freq: Every day | ORAL | Status: DC
  Administered 2015-11-20 – 2015-11-22 (×3): 40 mg via ORAL
  Filled 2015-11-20 (×3): qty 1

## 2015-11-20 MED ORDER — LEVOTHYROXINE SODIUM 75 MCG PO TABS
75.0000 ug | ORAL_TABLET | Freq: Every day | ORAL | Status: DC
  Administered 2015-11-21 – 2015-11-23 (×3): 75 ug via ORAL
  Filled 2015-11-20 (×3): qty 1

## 2015-11-20 MED ORDER — FENTANYL CITRATE (PF) 100 MCG/2ML IJ SOLN
50.0000 ug | Freq: Once | INTRAMUSCULAR | Status: AC
  Administered 2015-11-20: 50 ug via INTRAVENOUS
  Filled 2015-11-20: qty 2

## 2015-11-20 MED ORDER — GUAIFENESIN ER 600 MG PO TB12
600.0000 mg | ORAL_TABLET | Freq: Two times a day (BID) | ORAL | Status: DC
  Administered 2015-11-21 – 2015-11-23 (×5): 600 mg via ORAL
  Filled 2015-11-20 (×5): qty 1

## 2015-11-20 MED ORDER — ONDANSETRON HCL 4 MG/2ML IJ SOLN: 4.0000 mg | Freq: Three times a day (TID) | INTRAMUSCULAR | Status: DC | PRN

## 2015-11-20 MED ORDER — SODIUM CHLORIDE 0.9 % WEIGHT BASED INFUSION
1.0000 mL/kg/h | INTRAVENOUS | Status: DC
  Administered 2015-11-22: 1 mL/kg/h via INTRAVENOUS

## 2015-11-20 MED ORDER — ASPIRIN 81 MG PO CHEW: 81.0000 mg | CHEWABLE_TABLET | ORAL | Status: DC

## 2015-11-20 MED ORDER — SODIUM CHLORIDE 0.9 % IJ SOLN
3.0000 mL | Freq: Two times a day (BID) | INTRAMUSCULAR | Status: DC
  Administered 2015-11-21 – 2015-11-22 (×3): 3 mL via INTRAVENOUS

## 2015-11-20 MED ORDER — ALUM & MAG HYDROXIDE-SIMETH 200-200-20 MG/5ML PO SUSP
30.0000 mL | Freq: Once | ORAL | Status: AC
  Administered 2015-11-21: 30 mL via ORAL
  Filled 2015-11-20: qty 30

## 2015-11-20 MED ORDER — MORPHINE SULFATE (PF) 4 MG/ML IV SOLN
4.0000 mg | Freq: Once | INTRAVENOUS | Status: AC
  Administered 2015-11-20: 4 mg via INTRAVENOUS
  Filled 2015-11-20: qty 1

## 2015-11-20 MED ORDER — NITROGLYCERIN IN D5W 200-5 MCG/ML-% IV SOLN
0.0000 ug/min | INTRAVENOUS | Status: DC
  Administered 2015-11-20: 5 ug/min via INTRAVENOUS
  Filled 2015-11-20: qty 250

## 2015-11-20 MED ORDER — GI COCKTAIL ~~LOC~~
30.0000 mL | Freq: Once | ORAL | Status: AC
  Administered 2015-11-20: 30 mL via ORAL
  Filled 2015-11-20: qty 30

## 2015-11-20 MED ORDER — FENTANYL CITRATE (PF) 100 MCG/2ML IJ SOLN
50.0000 ug | INTRAMUSCULAR | Status: DC | PRN
  Administered 2015-11-20 – 2015-11-21 (×3): 50 ug via INTRAVENOUS
  Filled 2015-11-20 (×3): qty 2

## 2015-11-20 MED ORDER — HEPARIN BOLUS VIA INFUSION
2500.0000 [IU] | Freq: Once | INTRAVENOUS | Status: AC
  Administered 2015-11-20: 2500 [IU] via INTRAVENOUS
  Filled 2015-11-20: qty 2500

## 2015-11-20 MED ORDER — SODIUM CHLORIDE 0.9 % WEIGHT BASED INFUSION
3.0000 mL/kg/h | INTRAVENOUS | Status: AC
  Administered 2015-11-21: 3 mL/kg/h via INTRAVENOUS
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
## 2015-11-20 NOTE — ED Notes (Signed)
Attempted report 

## 2015-11-20 NOTE — ED Provider Notes (Signed)
  Face-to-face evaluation   History: She is here for evaluation of nausea for 3 days, chest pain for one day. Chest pain, currently resolved (17:30). She is not taking pain medicines because she was kicked out of her pain clinic.  Physical exam: Alert, elderly female who appears undernourished. Heart regular rate and rhythm, no murmur. Lungs clear anteriorly. She has a large subcutaneous device of the left upper chest wall region. There is a lead traveling toward the head, with this device.  Medical screening examination/treatment/procedure(s) were conducted as a shared visit with non-physician practitioner(s) and myself.  I personally evaluated the patient during the encounter  Daleen Bo, MD 11/21/15 [phone]

## 2015-11-20 NOTE — ED Notes (Signed)
Admitting at bedside 

## 2015-11-20 NOTE — Consult Note (Signed)
CARDIOLOGY CONSULT NOTE   Patient ID: Sarah Blackburn MRN: [id number], DOB/AGE: Apr 26, 1941   Admit date: 11/20/2015 Date of Consult: 11/20/2015  Primary Physician: Rachell Cipro, MD Primary Cardiologist: None  Reason for consult:  Chest pain, troponin elevation  Problem List  Past Medical History  Diagnosis Date  . Colon cancer ([place])   . Hypertension   . Thyroid disease   . Parkinson's disease     Past Surgical History  Procedure Laterality Date  . Colon surgery    . Abdominal hysterectomy    . Brain surgery      Allergies  No Known Allergies  HPI   Sarah Blackburn is a 75 y.o. female history of hyperlipidemia, Parkinson's disease, s/p brain stimulators implantation, colon cancer in remission, thyroid disease, ongoing smoking, presents to emergency department complaining of pain "all over" that started 2 days ago and kept her awake. Today at 3 am she developed chest pain radiating to her back that started at rest but improved with sl NTG that she obtained in the ambulance.  Her imitial POC and serum troponin are abnormal, she continues to have pain. She was recently discharged from the pain clinic.  Her BP is significantly elevated and she states that she has never has hypertension diagnosed before. She denies PND, orthopnea, LE edema, palpitations or syncope.  She states that her father had MI in his 40', brother as well but she doesn't remember at what age.   Inpatient Medications  . heparin  2,500 Units Intravenous Once    Family History History reviewed. No pertinent family history.   Social History Social History   Social History  . Marital Status: Divorced    Spouse Name: N/A  . Number of Children: N/A  . Years of Education: N/A   Occupational History  . Not on file.   Social History Main Topics  . Smoking status: Current Every Day Smoker  . Smokeless tobacco: Not on file  . Alcohol Use: Yes     Comment: intermittent  . Drug Use: No  . Sexual  Activity: Not on file   Other Topics Concern  . Not on file   Social History Narrative    Review of Systems  General:  No chills, fever, night sweats or weight changes.  Cardiovascular:  No chest pain, dyspnea on exertion, edema, orthopnea, palpitations, paroxysmal nocturnal dyspnea. Dermatological: No rash, lesions/masses Respiratory: No cough, dyspnea Urologic: No hematuria, dysuria Abdominal:   No nausea, vomiting, diarrhea, bright red blood per rectum, melena, or hematemesis Neurologic:  No visual changes, wkns, changes in mental status. All other systems reviewed and are otherwise negative except as noted above.  Physical Exam  Blood pressure 172/82, pulse 72, resp. rate 10, height 5\' 2"  (1.575 m), weight 95 lb (43.092 kg), SpO2 98 %.  General: Pleasant, NAD Psych: Normal affect. Neuro: Alert and oriented X 3. Moves all extremities spontaneously. HEENT: Normal  Neck: Supple without bruits or JVD. Lungs:  Resp regular and unlabored, CTA. Two brain stimulators in the sq tissue of he right and left frontal chest Heart: RRR no s3, s4, or murmurs. Abdomen: Soft, non-tender, non-distended, BS + x 4.  Extremities: No clubbing, cyanosis or edema. DP/PT/Radials 2+ and equal bilaterally.  Labs  Recent Labs  11/20/15 1540  TROPONINI 0.13*   Lab Results  Component Value Date   WBC 9.5 11/20/2015   HGB 13.8 11/20/2015   HCT 41.4 11/20/2015   MCV 95.8 11/20/2015   PLT 237 11/20/2015  Recent Labs Lab 11/20/15 1540  NA 143  K 4.0  CL 103  CO2 25  BUN 7  CREATININE 0.71  CALCIUM 10.3  PROT 7.0  BILITOT 1.0  ALKPHOS 71  ALT 12*  AST 16  GLUCOSE 95   No results found for: CHOL, HDL, LDLCALC, TRIG Lab Results  Component Value Date   DDIMER  04/05/2009    <0.22        AT THE INHOUSE ESTABLISHED CUTOFF VALUE OF 0.48 ug/mL FEU, THIS ASSAY HAS BEEN DOCUMENTED IN THE LITERATURE TO HAVE A SENSITIVITY AND NEGATIVE PREDICTIVE VALUE OF AT LEAST 98 TO 99%.  THE TEST  RESULT SHOULD BE CORRELATED WITH AN ASSESSMENT OF THE CLINICAL PROBABILITY OF DVT / VTE.   Radiology/Studies  Dg Chest 2 View  11/20/2015  CLINICAL DATA:  Chest and abdominal pain, lower LEFT anterior rib pain today, productive cough for 2 weeks, smoker, history oral cancer, colon cancer, hypertension, Parkinson's . IMPRESSION: No definite acute abnormalities as above.     Echocardiogram - none  ECG: SR, otherwise normal    ASSESSMENT AND PLAN  1. NSTEMI - we will start iv heparin drip and schedule for a left sided cath in the am - start aspirin 81 mg po daily, metoprolol 25 mg po BID and NTG drip for chest pain, switch to ACEI post cath, continue atorvastatin  2. Hypertension - as above, order echo to evaluate for LVH  3. Hyperlipidemia - continue atorvastatin    Signed, Dorothy Spark, MD, [facility] 11/20/2015, 5:58 PM

## 2015-11-20 NOTE — Progress Notes (Signed)
ANTICOAGULATION CONSULT NOTE - Initial Consult  Pharmacy Consult for Heparin Indication: chest pain/ACS  No Known Allergies  Patient Measurements:  Total Body Weight: 43.2 kg (patient reported) Ideal Body Weight: 50.1 kg  Vital Signs:    Labs:  Recent Labs  11/20/15 1540  HGB 13.8  HCT 41.4  PLT 237  CREATININE 0.71  TROPONINI 0.13*    CrCl cannot be calculated (Unknown ideal weight.).   Medical History: Past Medical History  Diagnosis Date  . Colon cancer ([place])   . Hypertension   . Thyroid disease   . Parkinson's disease     Assessment: 75yo F presents w/ L sided chest pain that began ~0300. EMS ekg WNL. No ASA given per pt request. Nitro x1 w/ no relief, refused 2nd Nitro. Not on any PTA AC medications. Trops 0.13, sCr 0.71, NCrCl ~95, Hgb 13.8, Plts 237, No s/sx of bleeding.  Goal of Therapy:  Heparin level 0.3-0.7 units/ml Monitor platelets by anticoagulation protocol: Yes   Plan:  Give 2500 units bolus x 1 Start heparin infusion at 500 units/hr Check anti-Xa level in 8 hours and daily while on heparin Continue to monitor H&H and platelets  Georgiann Mohs, PharmD Student    I agree with the above assessment and plan.  Nena Jordan, PharmD, BCPS 11/20/2015, 6:05 PM

## 2015-11-20 NOTE — ED Notes (Signed)
Attempted second EKG with monitor in room.  Patient laid flat, leads changed out twice, cord to machine changed, unable to obtain.  Will use portable machine for second attempt.

## 2015-11-20 NOTE — ED Notes (Addendum)
Pt arrives via EMS from home with left sided chest pain that began approx 0300. EMS ekg WNL. No ASA given per patient request. Nitro x1 with no relief, refused 2nd nitro. VSS
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
## 2015-11-21 ENCOUNTER — Observation Stay (HOSPITAL_COMMUNITY): Payer: Medicare Other

## 2015-11-21 ENCOUNTER — Encounter (HOSPITAL_COMMUNITY)
Admission: EM | Disposition: A | Discharge status: Home / Self Care | Payer: Self-pay | Source: Home / Self Care | Attending: Internal Medicine

## 2015-11-21 DIAGNOSIS — R079 Chest pain, unspecified: Secondary | ICD-10-CM

## 2015-11-21 DIAGNOSIS — K5903 Drug induced constipation: Secondary | ICD-10-CM | POA: Diagnosis not present

## 2015-11-21 DIAGNOSIS — I2510 Atherosclerotic heart disease of native coronary artery without angina pectoris: Secondary | ICD-10-CM | POA: Diagnosis not present

## 2015-11-21 DIAGNOSIS — E785 Hyperlipidemia, unspecified: Secondary | ICD-10-CM | POA: Diagnosis not present

## 2015-11-21 DIAGNOSIS — Z85038 Personal history of other malignant neoplasm of large intestine: Secondary | ICD-10-CM | POA: Diagnosis not present

## 2015-11-21 DIAGNOSIS — T40605A Adverse effect of unspecified narcotics, initial encounter: Secondary | ICD-10-CM | POA: Diagnosis not present

## 2015-11-21 DIAGNOSIS — G20 Parkinson's disease: Secondary | ICD-10-CM | POA: Diagnosis present

## 2015-11-21 DIAGNOSIS — R7989 Other specified abnormal findings of blood chemistry: Secondary | ICD-10-CM | POA: Diagnosis not present

## 2015-11-21 DIAGNOSIS — R100 Acute abdomen: Secondary | ICD-10-CM | POA: Diagnosis not present

## 2015-11-21 DIAGNOSIS — F17200 Nicotine dependence, unspecified, uncomplicated: Secondary | ICD-10-CM | POA: Diagnosis present

## 2015-11-21 DIAGNOSIS — I214 Non-ST elevation (NSTEMI) myocardial infarction: Secondary | ICD-10-CM | POA: Diagnosis present

## 2015-11-21 DIAGNOSIS — Z933 Colostomy status: Secondary | ICD-10-CM | POA: Diagnosis not present

## 2015-11-21 DIAGNOSIS — R410 Disorientation, unspecified: Secondary | ICD-10-CM | POA: Diagnosis not present

## 2015-11-21 DIAGNOSIS — I10 Essential (primary) hypertension: Secondary | ICD-10-CM | POA: Diagnosis present

## 2015-11-21 DIAGNOSIS — E039 Hypothyroidism, unspecified: Secondary | ICD-10-CM | POA: Diagnosis present

## 2015-11-21 DIAGNOSIS — I201 Angina pectoris with documented spasm: Secondary | ICD-10-CM | POA: Diagnosis present

## 2015-11-21 LAB — CBC
HCT: 37.6 % (ref 36.0–46.0)
Hemoglobin: 12.5 g/dL (ref 12.0–15.0)
MCH: 31.7 pg (ref 26.0–34.0)
MCHC: 33.2 g/dL (ref 30.0–36.0)
MCV: 95.4 fL (ref 78.0–100.0)
PLATELETS: 220 10*3/uL (ref 150–400)
RBC: 3.94 MIL/uL (ref 3.87–5.11)
RDW: 12.8 % (ref 11.5–15.5)
WBC: 8.2 10*3/uL (ref 4.0–10.5)

## 2015-11-21 LAB — BASIC METABOLIC PANEL
ANION GAP: 11 (ref 5–15)
BUN: 10 mg/dL (ref 6–20)
CALCIUM: 10 mg/dL (ref 8.9–10.3)
CO2: 29 mmol/L (ref 22–32)
CREATININE: 0.8 mg/dL (ref 0.44–1.00)
Chloride: 101 mmol/L (ref 101–111)
GFR calc Af Amer: >60 mL/min (ref >60)
Glucose, Bld: 80 mg/dL (ref 65–99)
POTASSIUM: 4 mmol/L (ref 3.5–5.1)
Sodium: 141 mmol/L (ref 135–145)

## 2015-11-21 LAB — URINALYSIS, ROUTINE W REFLEX MICROSCOPIC
BILIRUBIN URINE: NEGATIVE
Glucose, UA: NEGATIVE mg/dL
KETONES UR: NEGATIVE mg/dL
Nitrite: NEGATIVE
PROTEIN: NEGATIVE mg/dL
Specific Gravity, Urine: 1.025 (ref 1.005–1.030)
pH: 6 (ref 5.0–8.0)

## 2015-11-21 LAB — TSH: TSH: 1.799 u[IU]/mL (ref 0.350–4.500)

## 2015-11-21 LAB — MAGNESIUM: MAGNESIUM: 2.2 mg/dL (ref 1.7–2.4)

## 2015-11-21 LAB — LIPID PANEL
CHOL/HDL RATIO: 2.2 ratio
Cholesterol: 155 mg/dL (ref 0–200)
HDL: 72 mg/dL (ref >40)
LDL CALC: 61 mg/dL (ref 0–99)
Triglycerides: 109 mg/dL (ref <150)
VLDL: 22 mg/dL (ref 0–40)

## 2015-11-21 LAB — TROPONIN I
TROPONIN I: 0.11 ng/mL — AB (ref <0.031)
TROPONIN I: 0.06 ng/mL — AB (ref <0.031)

## 2015-11-21 LAB — PHOSPHORUS: Phosphorus: 4 mg/dL (ref 2.5–4.6)

## 2015-11-21 LAB — URINE MICROSCOPIC-ADD ON

## 2015-11-21 LAB — APTT: APTT: 70 s — AB (ref 24–37)

## 2015-11-21 LAB — HEPARIN LEVEL (UNFRACTIONATED): HEPARIN UNFRACTIONATED: 0.25 [IU]/mL — AB (ref 0.30–0.70)

## 2015-11-21 LAB — MRSA PCR SCREENING: MRSA by PCR: NEGATIVE

## 2015-11-21 LAB — PROTIME-INR
INR: 1.08 (ref 0.00–1.49)
Prothrombin Time: 14.2 seconds (ref 11.6–15.2)

## 2015-11-21 SURGERY — LEFT HEART CATH AND CORONARY ANGIOGRAPHY

## 2015-11-21 MED ORDER — MORPHINE SULFATE (PF) 2 MG/ML IV SOLN
2.0000 mg | INTRAVENOUS | Status: DC | PRN
  Administered 2015-11-21 – 2015-11-22 (×5): 2 mg via INTRAVENOUS
  Filled 2015-11-21 (×5): qty 1

## 2015-11-21 MED ORDER — METOPROLOL TARTRATE 50 MG PO TABS
50.0000 mg | ORAL_TABLET | Freq: Two times a day (BID) | ORAL | Status: DC
  Filled 2015-11-21 (×2): qty 1

## 2015-11-21 MED ORDER — MORPHINE SULFATE (PF) 2 MG/ML IV SOLN
1.0000 mg | INTRAVENOUS | Status: DC | PRN
  Administered 2015-11-21: 1 mg via INTRAVENOUS

## 2015-11-21 MED ORDER — SODIUM CHLORIDE 0.9 % IV BOLUS (SEPSIS)
500.0000 mL | Freq: Once | INTRAVENOUS | Status: AC
  Administered 2015-11-21: 500 mL via INTRAVENOUS

## 2015-11-21 MED ORDER — PROMETHAZINE HCL 25 MG/ML IJ SOLN
12.5000 mg | Freq: Four times a day (QID) | INTRAMUSCULAR | Status: DC | PRN
  Administered 2015-11-21: 12.5 mg via INTRAVENOUS
  Filled 2015-11-21: qty 1

## 2015-11-21 MED ORDER — HALOPERIDOL LACTATE 5 MG/ML IJ SOLN: 2.0000 mg | Freq: Four times a day (QID) | INTRAMUSCULAR | Status: DC | PRN

## 2015-11-21 MED ORDER — MORPHINE SULFATE (PF) 2 MG/ML IV SOLN
INTRAVENOUS | Status: AC
  Filled 2015-11-21: qty 1
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
## 2015-11-21 NOTE — Progress Notes (Signed)
Pt back from cardiac echo, agitation and confusion noted, wanting to leave room, combative to nurse and nurse tech, unable to reorient pt, unable to educate on safety,  Dr Karleen Hampshire notified, staff member staying in room for pt safety, call light in reach, sitting in chair at present with chair alarm activated, will continue to monitor closely.  Edward Qualia RN

## 2015-11-21 NOTE — Progress Notes (Signed)
Subjective: Continues to have CP  Objective: Vital signs in last 24 hours: Temp:  [97.5 F (36.4 C)-98.5 F (36.9 C)] 97.5 F (36.4 C) (01/24 0326) Pulse Rate:  [66-87] 70 (01/23 2358) Resp:  [10-25] 22 (01/24 0326) BP: (100-172)/(54-82) 105/62 mmHg (01/24 0326) SpO2:  [95 %-98 %] 95 % (01/24 0326) Weight:  [95 lb (43.092 kg)-105 lb 3.2 oz (47.718 kg)] 102 lb 8.7 oz (46.512 kg) (01/24 0326) Last BM Date: 11/21/15  Intake/Output from previous day: 01/23 0701 - 01/24 0700 In: 240 [P.O.:240] Out: 200 [Urine:200] Intake/Output this shift:    Medications Scheduled Meds: . aspirin  324 mg Oral NOW   Or  . aspirin  300 mg Rectal NOW  . aspirin  81 mg Oral Pre-Cath  . aspirin EC  81 mg Oral Daily  . atorvastatin  40 mg Oral QHS  . guaiFENesin  600 mg Oral BID  . levothyroxine  75 mcg Oral QAC breakfast  . metoprolol tartrate  25 mg Oral BID  . pantoprazole  40 mg Oral Daily  . sodium chloride  3 mL Intravenous Q12H   Continuous Infusions: . sodium chloride 1 mL/kg/hr (11/21/15 0759)  . heparin 600 Units/hr (11/21/15 0329)  . nitroGLYCERIN 15 mcg/min (11/20/15 1902)   PRN Meds:.sodium chloride, acetaminophen, fentaNYL (SUBLIMAZE) injection, levalbuterol, nitroGLYCERIN, ondansetron (ZOFRAN) IV, promethazine, sodium chloride  PE: General appearance: alert, cooperative and no distress Lungs: clear to auscultation bilaterally Heart: regular rate and rhythm, S1, S2 normal, no murmur, click, rub or gallop Chest: Nontender. Extremities: No LEE Pulses: 2+ and symmetric Skin: Warm and dry Neurologic: Grossly normal  Lab Results:   Recent Labs  11/20/15 1540 11/21/15 0220  WBC 9.5 8.2  HGB 13.8 12.5  HCT 41.4 37.6  PLT 237 220   BMET  Recent Labs  11/20/15 1540 11/21/15 0220  NA 143 141  K 4.0 4.0  CL 103 101  CO2 25 29  GLUCOSE 95 80  BUN 7 10  CREATININE 0.71 0.80  CALCIUM 10.3 10.0   PT/INR  Recent Labs  11/21/15 0220  LABPROT 14.2  INR 1.08    Cholesterol  Recent Labs  11/21/15 0220  CHOL 155   Lipid Panel     Component Value Date/Time   CHOL 155 11/21/2015 0220   TRIG 109 11/21/2015 0220   HDL 72 11/21/2015 0220   CHOLHDL 2.2 11/21/2015 0220   VLDL 22 11/21/2015 0220   LDLCALC 61 11/21/2015 0220    Cardiac Panel (last 3 results)  Recent Labs  11/20/15 1540 11/20/15 2222 11/21/15 0220  TROPONINI 0.13* 0.14* 0.11*      Assessment/Plan  75 y.o. female history of hyperlipidemia, Parkinson's disease, s/p brain stimulators implantation, colon cancer in remission, thyroid disease, ongoing smoking, presents to emergency department complaining of pain "all over" that started 2 days ago and kept her awake. Today at 3 am she developed chest pain radiating to her back that started at rest but improved with sl NTG  Active Problems:   Chest pain Continues to have severe CP.  EKG this morning is not acute.  Giving morphine 1mg .  If she tolerates it, give another 1mg .  She is only 46 Kg.  Stop fentanyl.    NSTEMI (non-ST elevated myocardial infarction) ([facility]) Peak troponin 0.14.  Started in IV heparin and on for a left heart cath today.  Also on ASA, metoprolol 25 bid, NTG drip, statin.  Left heart cath after lunch.     Hypertension  Mildly hypotensive this morning.     Leg cramps:    LOS: 1 day    HAGER, BRYAN PA-C 11/21/2015 8:18 AM  The patient was seen, examined and discussed with Sarah Fuller, PA-C and I agree with the above.   75 year old female who presented with non-STEMI yesterday, she developed mental status changes overnight, we will postpone her cardiac catheterization for now. We can discontinue heparin drip as her troponin is now downtrending. She is mildly hypertensive, we will increase her metoprolol to 50 mg by mouth twice a day.  Sarah Blackburn 11/21/2015

## 2015-11-21 NOTE — Progress Notes (Signed)
ANTICOAGULATION CONSULT NOTE - Follow Up Consult  Pharmacy Consult for heparin Indication: chest pain/ACS  No Known Allergies  Patient Measurements: Height: 5\' 1"  (154.9 cm) Weight: 102 lb 8.7 oz (46.512 kg) IBW/kg (Calculated) : 47.8 Heparin Dosing Weight: 46.5 kg  Vital Signs: Temp: 98.9 F (37.2 C) (01/24 0842) Temp Source: Oral (01/24 0842) Pulse Rate: 71 (01/24 0842)  Labs:  Recent Labs  11/20/15 1540 11/20/15 2222 11/21/15 0220 11/21/15 1128  HGB 13.8  --  12.5  --   HCT 41.4  --  37.6  --   PLT 237  --  220  --   APTT  --   --  70*  --   LABPROT  --   --  14.2  --   INR  --   --  1.08  --   HEPARINUNFRC  --   --  0.25*  --   CREATININE 0.71  --  0.80  --   TROPONINI 0.13* 0.14* 0.11* 0.06*    Estimated Creatinine Clearance: 46 mL/min (by C-G formula based on Cr of 0.8).   Medications:  Scheduled:  . aspirin  324 mg Oral NOW   Or  . aspirin  300 mg Rectal NOW  . aspirin  81 mg Oral Pre-Cath  . aspirin EC  81 mg Oral Daily  . atorvastatin  40 mg Oral QHS  . guaiFENesin  600 mg Oral BID  . levothyroxine  75 mcg Oral QAC breakfast  . metoprolol tartrate  50 mg Oral BID  . morphine      . pantoprazole  40 mg Oral Daily  . sodium chloride  3 mL Intravenous Q12H   Infusions:  . sodium chloride 1 mL/kg/hr (11/21/15 1600)  . heparin 600 Units/hr (11/21/15 1600)  . nitroGLYCERIN 5 mcg/min (11/21/15 1600)    Assessment: 75 yo F on heparin for chest pain.  Pt was initially scheduled for ECHO and cardiac cath today, however patient became extremely agitated in ECHO procedure and cardiac cath was cancelled.  Pt also lost IV access at this time (~1030) and refused to have it restarted.  Since this time, patient's agitation has decreased, an IV was placed and heparin was restarted at 1600 today.  Goal of Therapy:  Heparin level 0.3-0.7 units/ml Monitor platelets by anticoagulation protocol: Yes   Plan:  Restart heparin at 600 units/hr. Heparin level at  midnight.  [employer], Pharm.D., BCPS Clinical Pharmacist Pager [phone] 11/21/2015 5:23 PM

## 2015-11-21 NOTE — Progress Notes (Signed)
Patient agitated and combative, refusing to sit or lay in bed.  Also refusing to have IV continue to be connected.  Assisted to chair with chair alarm.  Spoke with patient's son fiance and she stated they would be up later today.  Patient's speech slurred, oriented to person and time.  States she has seen son in the room and son has not been here.  Dr. Karleen Hampshire is on floor and in to see patient.  Tarri Fuller PA paged and notified and will come up to see patient.  Sanda Linger

## 2015-11-21 NOTE — Progress Notes (Signed)
  Echocardiogram 2D Echocardiogram has been performed.  Sarah Blackburn 11/21/2015, 10:16 AM

## 2015-11-21 NOTE — Progress Notes (Signed)
ANTICOAGULATION CONSULT NOTE - Follow Up Consult  Pharmacy Consult for Heparin  Indication: chest pain/ACS  No Known Allergies  Patient Measurements: Height: 5\' 1"  (154.9 cm) Weight: 105 lb 3.2 oz (47.718 kg) IBW/kg (Calculated) : 47.8  Vital Signs: Temp: 98.1 F (36.7 C) (01/23 2358) Temp Source: Oral (01/23 2358) BP: 100/54 mmHg (01/23 2358) Pulse Rate: 70 (01/23 2358)  Labs:  Recent Labs  11/20/15 1540 11/20/15 2222 11/21/15 0220  HGB 13.8  --  12.5  HCT 41.4  --  37.6  PLT 237  --  220  HEPARINUNFRC  --   --  0.25*  CREATININE 0.71  --   --   TROPONINI 0.13* 0.14*  --     Estimated Creatinine Clearance: 47.2 mL/min (by C-G formula based on Cr of 0.71).  Assessment: Heparin for CP, mildly elevated trop, initial heparin level is low at 0.25, no issues per RN.   Goal of Therapy:  Heparin level 0.3-0.7 units/ml Monitor platelets by anticoagulation protocol: Yes   Plan:  -Increase heparin to 600 units/hr -1100 HL  Sarah Blackburn 11/21/2015,3:00 AM

## 2015-11-22 ENCOUNTER — Encounter (HOSPITAL_COMMUNITY): Payer: Self-pay | Admitting: Cardiovascular Disease

## 2015-11-22 ENCOUNTER — Inpatient Hospital Stay (HOSPITAL_COMMUNITY): Payer: Medicare Other

## 2015-11-22 ENCOUNTER — Encounter (HOSPITAL_COMMUNITY)
Admission: EM | Disposition: A | Discharge status: Home / Self Care | Payer: Self-pay | Source: Home / Self Care | Attending: Internal Medicine

## 2015-11-22 DIAGNOSIS — I209 Angina pectoris, unspecified: Secondary | ICD-10-CM

## 2015-11-22 DIAGNOSIS — R410 Disorientation, unspecified: Secondary | ICD-10-CM | POA: Insufficient documentation

## 2015-11-22 DIAGNOSIS — G20 Parkinson's disease: Secondary | ICD-10-CM | POA: Insufficient documentation

## 2015-11-22 DIAGNOSIS — I2510 Atherosclerotic heart disease of native coronary artery without angina pectoris: Secondary | ICD-10-CM

## 2015-11-22 DIAGNOSIS — R7989 Other specified abnormal findings of blood chemistry: Secondary | ICD-10-CM

## 2015-11-22 HISTORY — PX: CARDIAC CATHETERIZATION: SHX172

## 2015-11-22 LAB — CBC
HCT: 33.5 % — ABNORMAL LOW (ref 36.0–46.0)
Hemoglobin: 11 g/dL — ABNORMAL LOW (ref 12.0–15.0)
MCH: 31.8 pg (ref 26.0–34.0)
MCHC: 32.8 g/dL (ref 30.0–36.0)
MCV: 96.8 fL (ref 78.0–100.0)
PLATELETS: 195 10*3/uL (ref 150–400)
RBC: 3.46 MIL/uL — ABNORMAL LOW (ref 3.87–5.11)
RDW: 13.1 % (ref 11.5–15.5)
WBC: 6.8 10*3/uL (ref 4.0–10.5)

## 2015-11-22 LAB — HEMOGLOBIN A1C
Hgb A1c MFr Bld: 5.2 % (ref 4.8–5.6)
Mean Plasma Glucose: 103 mg/dL

## 2015-11-22 LAB — HEPARIN LEVEL (UNFRACTIONATED): HEPARIN UNFRACTIONATED: 0.22 [IU]/mL — AB (ref 0.30–0.70)

## 2015-11-22 SURGERY — LEFT HEART CATH AND CORONARY ANGIOGRAPHY

## 2015-11-22 MED ORDER — MORPHINE SULFATE (PF) 2 MG/ML IV SOLN
2.0000 mg | Freq: Once | INTRAVENOUS | Status: AC
  Administered 2015-11-22: 2 mg via INTRAVENOUS
  Filled 2015-11-22: qty 1

## 2015-11-22 MED ORDER — NITROGLYCERIN 1 MG/10 ML FOR IR/CATH LAB
INTRA_ARTERIAL | Status: DC | PRN
  Administered 2015-11-22: 200 ug

## 2015-11-22 MED ORDER — POLYETHYLENE GLYCOL 3350 17 G PO PACK
17.0000 g | PACK | Freq: Every day | ORAL | Status: DC
  Administered 2015-11-22: 17 g via ORAL
  Filled 2015-11-22: qty 1

## 2015-11-22 MED ORDER — POLYETHYLENE GLYCOL 3350 17 G PO PACK
17.0000 g | PACK | Freq: Two times a day (BID) | ORAL | Status: DC
  Administered 2015-11-22 – 2015-11-23 (×2): 17 g via ORAL
  Filled 2015-11-22 (×2): qty 1

## 2015-11-22 MED ORDER — FENTANYL CITRATE (PF) 100 MCG/2ML IJ SOLN
INTRAMUSCULAR | Status: AC
  Filled 2015-11-22: qty 2

## 2015-11-22 MED ORDER — SODIUM CHLORIDE 0.9 % IV BOLUS (SEPSIS)
500.0000 mL | Freq: Once | INTRAVENOUS | Status: AC
  Administered 2015-11-22: 500 mL via INTRAVENOUS

## 2015-11-22 MED ORDER — ISOSORBIDE MONONITRATE ER 30 MG PO TB24
30.0000 mg | ORAL_TABLET | Freq: Every day | ORAL | Status: DC
  Administered 2015-11-22 – 2015-11-23 (×2): 30 mg via ORAL
  Filled 2015-11-22 (×2): qty 1

## 2015-11-22 MED ORDER — ZOLPIDEM TARTRATE 5 MG PO TABS
5.0000 mg | ORAL_TABLET | Freq: Once | ORAL | Status: AC
  Administered 2015-11-22: 5 mg via ORAL
  Filled 2015-11-22: qty 1

## 2015-11-22 MED ORDER — HEPARIN (PORCINE) IN NACL 2-0.9 UNIT/ML-% IJ SOLN
INTRAMUSCULAR | Status: AC
Start: 2015-11-22 — End: 2015-11-22
  Filled 2015-11-22: qty 1000

## 2015-11-22 MED ORDER — LIDOCAINE HCL (PF) 1 % IJ SOLN
INTRAMUSCULAR | Status: AC
  Filled 2015-11-22: qty 30

## 2015-11-22 MED ORDER — DOCUSATE SODIUM 50 MG PO CAPS
50.0000 mg | ORAL_CAPSULE | Freq: Two times a day (BID) | ORAL | Status: DC
  Administered 2015-11-22: 50 mg via ORAL
  Filled 2015-11-22 (×3): qty 1

## 2015-11-22 MED ORDER — SODIUM CHLORIDE 0.9% FLUSH
3.0000 mL | Freq: Two times a day (BID) | INTRAVENOUS | Status: DC
  Administered 2015-11-22: 3 mL via INTRAVENOUS

## 2015-11-22 MED ORDER — BISACODYL 5 MG PO TBEC
5.0000 mg | DELAYED_RELEASE_TABLET | Freq: Once | ORAL | Status: AC
  Administered 2015-11-22: 5 mg via ORAL
  Filled 2015-11-22: qty 1

## 2015-11-22 MED ORDER — HEPARIN SODIUM (PORCINE) 1000 UNIT/ML IJ SOLN
INTRAMUSCULAR | Status: DC | PRN
  Administered 2015-11-22: 2500 [IU] via INTRAVENOUS

## 2015-11-22 MED ORDER — NITROGLYCERIN 1 MG/10 ML FOR IR/CATH LAB
INTRA_ARTERIAL | Status: AC
  Filled 2015-11-22: qty 10

## 2015-11-22 MED ORDER — NITROGLYCERIN 1 MG/10 ML FOR IR/CATH LAB
INTRA_ARTERIAL | Status: DC | PRN
  Administered 2015-11-22: 15:00:00

## 2015-11-22 MED ORDER — VERAPAMIL HCL 2.5 MG/ML IV SOLN
INTRAVENOUS | Status: AC
  Filled 2015-11-22: qty 2

## 2015-11-22 MED ORDER — SODIUM CHLORIDE 0.9 % WEIGHT BASED INFUSION: 1.0000 mL/kg/h | INTRAVENOUS | Status: AC

## 2015-11-22 MED ORDER — SODIUM CHLORIDE 0.9 % IV SOLN: 250.0000 mL | INTRAVENOUS | Status: DC | PRN

## 2015-11-22 MED ORDER — FENTANYL CITRATE (PF) 100 MCG/2ML IJ SOLN
INTRAMUSCULAR | Status: DC | PRN
  Administered 2015-11-22 (×2): 25 ug via INTRAVENOUS

## 2015-11-22 MED ORDER — IOHEXOL 350 MG/ML SOLN
INTRAVENOUS | Status: DC | PRN
  Administered 2015-11-22: 55 mL via INTRAVENOUS

## 2015-11-22 MED ORDER — SODIUM CHLORIDE 0.9% FLUSH: 3.0000 mL | INTRAVENOUS | Status: DC | PRN

## 2015-11-22 MED ORDER — HALOPERIDOL LACTATE 5 MG/ML IJ SOLN: 1.0000 mg | Freq: Three times a day (TID) | INTRAMUSCULAR | Status: DC | PRN

## 2015-11-22 MED ORDER — VERAPAMIL HCL 2.5 MG/ML IV SOLN
INTRAVENOUS | Status: DC | PRN
  Administered 2015-11-22: 14:00:00 via INTRA_ARTERIAL

## 2015-11-22 MED ORDER — MORPHINE SULFATE (PF) 2 MG/ML IV SOLN
2.0000 mg | Freq: Four times a day (QID) | INTRAVENOUS | Status: DC | PRN
  Administered 2015-11-23 (×2): 2 mg via INTRAVENOUS
  Filled 2015-11-22 (×2): qty 1

## 2015-11-22 MED ORDER — METOPROLOL TARTRATE 25 MG PO TABS
37.5000 mg | ORAL_TABLET | Freq: Two times a day (BID) | ORAL | Status: DC
  Administered 2015-11-22: 37.5 mg via ORAL
  Filled 2015-11-22 (×2): qty 1

## 2015-11-22 MED ORDER — HEPARIN SODIUM (PORCINE) 1000 UNIT/ML IJ SOLN
INTRAMUSCULAR | Status: AC
  Filled 2015-11-22: qty 1

## 2015-11-22 SURGICAL SUPPLY — 12 items
CATH INFINITI 5 FR JL3.5 (CATHETERS) ×3 IMPLANT
CATH INFINITI 5FR ANG PIGTAIL (CATHETERS) ×3 IMPLANT
CATH OPTITORQUE JACKY 4.0 5F (CATHETERS) ×3 IMPLANT
DEVICE RAD COMP TR BAND LRG (VASCULAR PRODUCTS) ×3 IMPLANT
GLIDESHEATH SLEND SS 6F .021 (SHEATH) ×3 IMPLANT
KIT HEART LEFT (KITS) ×3 IMPLANT
PACK CARDIAC CATHETERIZATION (CUSTOM PROCEDURE TRAY) ×3 IMPLANT
SYR MEDRAD MARK V 150ML (SYRINGE) ×3 IMPLANT
TRANSDUCER W/STOPCOCK (MISCELLANEOUS) ×3 IMPLANT
TUBING CIL FLEX 10 FLL-RA (TUBING) ×3 IMPLANT
WIRE HI TORQ VERSACORE-J 145CM (WIRE) ×3 IMPLANT
WIRE SAFE-T 1.5MM-J .035X260CM (WIRE) ×3 IMPLANT

## 2015-11-22 NOTE — Progress Notes (Signed)
Pt having severe abd pain unrelieved by prn and ordered treatments,  MD notified, new orders for abd xray received, continue to apply hot packs.  Edward Qualia RN

## 2015-11-22 NOTE — H&P (View-Only) (Signed)
Subjective: Complains of LUQ pain and tenderness in ribs above that.   Objective: Vital signs in last 24 hours: Temp:  [98 F (36.7 C)-98.3 F (36.8 C)] 98.2 F (36.8 C) (01/25 0800) Pulse Rate:  [56] 56 (01/25 0800) Resp:  [14-20] 14 (01/25 0700) BP: (85-131)/(42-95) 102/65 mmHg (01/25 0700) SpO2:  [93 %-96 %] 96 % (01/25 0800) Weight:  [109 lb 3.2 oz (49.533 kg)] 109 lb 3.2 oz (49.533 kg) (01/25 0500) Last BM Date: 11/22/15  Intake/Output from previous day: 01/24 0701 - 01/25 0700 In: 375.9 [I.V.:375.9] Out: 400 [Urine:400] Intake/Output this shift:    Medications Scheduled Meds: . aspirin EC  81 mg Oral Daily  . atorvastatin  40 mg Oral QHS  . guaiFENesin  600 mg Oral BID  . levothyroxine  75 mcg Oral QAC breakfast  . metoprolol tartrate  50 mg Oral BID  . pantoprazole  40 mg Oral Daily  . sodium chloride  3 mL Intravenous Q12H   Continuous Infusions: . sodium chloride 1 mL/kg/hr (11/22/15 0258)  . heparin 750 Units/hr (11/22/15 0150)  . nitroGLYCERIN 5 mcg/min (11/21/15 1600)   PRN Meds:.sodium chloride, acetaminophen, haloperidol lactate, levalbuterol, morphine injection, morphine injection, nitroGLYCERIN, ondansetron (ZOFRAN) IV, promethazine, sodium chloride  PE: General appearance: alert, cooperative and no distress Lungs: clear to auscultation bilaterally Heart: regular rate and rhythm, S1, S2 normal, no murmur, click, rub or gallop Abdomen: +BS, tender in the LUQ and left ribs. Extremities: No LEE Pulses: 2+ and symmetric Skin: Warm and dry Neurologic: Grossly normal  Lab Results:   Recent Labs  11/20/15 1540 11/21/15 0220 11/22/15 0015  WBC 9.5 8.2 6.8  HGB 13.8 12.5 11.0*  HCT 41.4 37.6 33.5*  PLT 237 220 195   BMET  Recent Labs  11/20/15 1540 11/21/15 0220  NA 143 141  K 4.0 4.0  CL 103 101  CO2 25 29  GLUCOSE 95 80  BUN 7 10  CREATININE 0.71 0.80  CALCIUM 10.3 10.0   PT/INR  Recent Labs  11/21/15 0220  LABPROT 14.2    INR 1.08   Cholesterol  Recent Labs  11/21/15 0220  CHOL 155   Cardiac Panel (last 3 results)  Recent Labs  11/20/15 2222 11/21/15 0220 11/21/15 1128  TROPONINI 0.14* 0.11* 0.06*     Assessment/Plan  75 y.o. female history of hyperlipidemia, Parkinson's disease, s/p brain stimulators implantation, colon cancer in remission, thyroid disease, ongoing smoking, presents to emergency department complaining of pain "all over" that started 2 days ago and kept her awake. Today at 3 am she developed chest pain radiating to her back that started at rest but improved with sl NTG  Active Problems:  Chest pain She is tender in the ribs below her left breast.   NSTEMI (non-ST elevated myocardial infarction) (HCC) Peak troponin 0.14. Started in IV heparin and on for a left heart cath today. Also on ASA, metoprolol 50 bid(increased 1/24), NTG drip, statin. Left heart cath postponed yesterday due to severe mental status changes.  From Ambien?  She is back on the schedule for cath today.  Recommend no sedation.      Hypertension Mildly hypotensive again this morning.     LUQ abd pain  Lipase WNL two days ago.  May need abd xray or [place].  LBM today.    LOS: 2 days    HAGER, BRYAN PA-C 11/22/2015 8:49 AM   The patient was seen, examined and discussed with Tarri Fuller, PA-C and I agree  with the above.   75 year old female who presented with non-STEMI on 11/20/15, she developed mental status changes overnight, we will postpone her cardiac catheterization for today. Her BP has improved with increased metoprolol to 50 mg by mouth twice a day.  Dorothy Spark 11/22/2015

## 2015-11-22 NOTE — Progress Notes (Signed)
[facility] PROGRESS NOTE  Sarah Blackburn [id number] DOB: February 07, 1941 DOA: 11/20/2015 PCP: Rachell Cipro, MD Interim summary: Sarah Blackburn is a 75 y.o. female has a past medical history of Colon cancer ([place]); Hypertension; Thyroid disease; and Parkinson's disease, Presented with left-sided chest pain, not associated with sob. Chest x-ray unremarkable. EKG showing no evidence of acute ischemia. troponin's were found to be minimally elevated.  Cardiology consulted and recommendations given, and she was started on heparin gtt.  Over night she was given ambien, fentanyl and morphine and earlier this am she was confused and altered and tried to get out of bed and hit the sitter.   Assessment/Plan: 1. NSTEMI: - admitted to telemetry. And started on IV heparin and IV NTG -Left heart cath with non obstructive disease and NSTEMI most likely secondary to vasospasm  -will continue B-blockers, Lipitor, ASA and IMDUR -will monitor overnight and if remains stable most likely home in am  2. Hypertension: -much better Controlled.  -medication adjusted as she will also be on IMDUR now  3. Parkinson's disease: - s/p brain stimulator , continue outpatient follow up with neurology.   4. Hypothyroidism: -TSH within normal limits.  -continue synthroid  5. Constipation: due to use of narcotics -will use colace and miralax -patient with hx of colostomy; [facility] following patient  6. Agitation/sundowning: worse with use of benzo's -now stable/resolved -will monitor -avoid sedatives     Code Status: full code.  Family Communication: none at bedside.  Disposition Plan: pending further investigation   Consultants:  Cardiology.   Procedures:  Cardiac cath:1/25 -Prox RCA to Mid RCA lesion, 10% stenosed. -The left ventricular systolic function is normal. -Prox LAD to Mid LAD lesion, 40% stenosed. -Mild nonobstructive coronary artery disease. Diffuse spasm noted in the left coronary  system which improved significantly with intracoronary nitroglycerin. -Normal LV systolic function and mildly elevated left ventricular end-diastolic pressure.  Recommendations: Aggressive medical therapy and smoking cessation. I started the patient on long-acting nitroglycerin. The etiology for her small non-ST elevation myocardial infarction might be related to coronary spasm.   Antibiotics:  none  HPI/Subjective: Oriented X2, no SOB. Still with intermittent chest discomfort   Objective: Filed Vitals:   11/22/15 1457 11/22/15 1545  BP:  97/55  Pulse: 0 120  Temp:  98 F (36.7 C)  Resp: 0     Intake/Output Summary (Last 24 hours) at 11/22/15 1805 Last data filed at 11/22/15 1300  Gross per 24 hour  Intake 256.38 ml  Output   1000 ml  Net -743.62 ml   Filed Weights   11/20/15 2112 11/21/15 0326 11/22/15 0500  Weight: 47.718 kg (105 lb 3.2 oz) 46.512 kg (102 lb 8.7 oz) 49.533 kg (109 lb 3.2 oz)    Exam:   General:  Alert, awake and oriented. Reports some intermittent chest discomfort and denies SOB. Patient is afebrile.  Cardiovascular: s1s2, no rubs or gallops  Respiratory: clear to auscultation, no use of accessory muscles  Abdomen: soft, non tender, non distended, positive bowel sounds   Musculoskeletal: no pedal edema.   Data Reviewed: Basic Metabolic Panel:  Recent Labs Lab 11/20/15 1540 11/20/15 2222 11/21/15 0220  NA 143  --  141  K 4.0  --  4.0  CL 103  --  101  CO2 25  --  29  GLUCOSE 95  --  80  BUN 7  --  10  CREATININE 0.71  --  0.80  CALCIUM 10.3  --  10.0  MG  --  2.0 2.2  PHOS  --  3.8 4.0   Liver Function Tests:  Recent Labs Lab 11/20/15 1540  AST 16  ALT 12*  ALKPHOS 71  BILITOT 1.0  PROT 7.0  ALBUMIN 4.4    Recent Labs Lab 11/20/15 1540  LIPASE 23   CBC:  Recent Labs Lab 11/20/15 1540 11/21/15 0220 11/22/15 0015  WBC 9.5 8.2 6.8  HGB 13.8 12.5 11.0*  HCT 41.4 37.6 33.5*  MCV 95.8 95.4 96.8  PLT 237 220  195   Cardiac Enzymes:  Recent Labs Lab 11/20/15 1540 11/20/15 2222 11/21/15 0220 11/21/15 1128  TROPONINI 0.13* 0.14* 0.11* 0.06*    CBG: No results for input(s): GLUCAP in the last 168 hours.  Recent Results (from the past 240 hour(s))  MRSA PCR Screening     Status: None   Collection Time: 11/20/15  9:57 PM  Result Value Ref Range Status   MRSA by PCR NEGATIVE NEGATIVE Final    Comment:        The GeneXpert MRSA Assay (FDA approved for NASAL specimens only), is one component of a comprehensive MRSA colonization surveillance program. It is not intended to diagnose MRSA infection nor to guide or monitor treatment for MRSA infections.      Studies: No results found.  Scheduled Meds: . aspirin EC  81 mg Oral Daily  . atorvastatin  40 mg Oral QHS  . docusate sodium  50 mg Oral BID  . guaiFENesin  600 mg Oral BID  . isosorbide mononitrate  30 mg Oral Daily  . levothyroxine  75 mcg Oral QAC breakfast  . metoprolol tartrate  37.5 mg Oral BID  . pantoprazole  40 mg Oral Daily  . polyethylene glycol  17 g Oral BID  . sodium chloride flush  3 mL Intravenous Q12H   Continuous Infusions: . sodium chloride 1 mL/kg/hr (11/22/15 1524)    Active Problems:   Chest pain   Elevated troponin   NSTEMI (non-ST elevated myocardial infarction) ([place])   Hypertension    Time spent: 25 minutes.     Barton Dubois  [facility] Pager [phone] . If 7PM-7AM, please contact night-coverage at [email or web address], password [facility] 11/22/2015, 6:05 PM  LOS: 2 days

## 2015-11-22 NOTE — Progress Notes (Signed)
Pt c/o constipation, Dr Dyann Kief notified new order received for miralax, will continue to monitor closely.  Edward Qualia RN

## 2015-11-22 NOTE — Progress Notes (Signed)
ANTICOAGULATION CONSULT NOTE  Pharmacy Consult for heparin Indication: chest pain/ACS  No Known Allergies  Patient Measurements: Height: 5\' 1"  (154.9 cm) Weight: 102 lb 8.7 oz (46.512 kg) IBW/kg (Calculated) : 47.8 Heparin Dosing Weight: 46.5 kg  Vital Signs: Temp: 98.1 F (36.7 C) (01/25 0004) Temp Source: Oral (01/25 0004) BP: 104/53 mmHg (01/25 0000) Pulse Rate: 56 (01/24 1745)  Labs:  Recent Labs  11/20/15 1540 11/20/15 2222 11/21/15 0220 11/21/15 1128 11/22/15 0015 11/22/15 0025  HGB 13.8  --  12.5  --  11.0*  --   HCT 41.4  --  37.6  --  33.5*  --   PLT 237  --  220  --  195  --   APTT  --   --  70*  --   --   --   LABPROT  --   --  14.2  --   --   --   INR  --   --  1.08  --   --   --   HEPARINUNFRC  --   --  0.25*  --   --  0.22*  CREATININE 0.71  --  0.80  --   --   --   TROPONINI 0.13* 0.14* 0.11* 0.06*  --   --     Estimated Creatinine Clearance: 46 mL/min (by C-G formula based on Cr of 0.8).  Assessment: 75 y.o. female with chest pain for heparin  Goal of Therapy:  Heparin level 0.3-0.7 units/ml Monitor platelets by anticoagulation protocol: Yes   Plan:  Increase Heparin 750 units/hr Follow-up am labs.   Phillis Knack, PharmD, BCPS   11/22/2015 1:23 AM

## 2015-11-22 NOTE — Interval H&P Note (Signed)
History and Physical Interval Note:  11/22/2015 2:09 PM  Sarah Blackburn  has presented today for surgery, with the diagnosis of cp  The various methods of treatment have been discussed with the patient and family. After consideration of risks, benefits and other options for treatment, the patient has consented to  Procedure(s): Left Heart Cath and Coronary Angiography (N/A) as a surgical intervention .  The patient's history has been reviewed, patient examined, no change in status, stable for surgery.  I have reviewed the patient's chart and labs.  Questions were answered to the patient's satisfaction.     [name]

## 2015-11-22 NOTE — Progress Notes (Signed)
BP 87/48, HR 50s.  Tonight's dose of metoprolol held.  Will continue to monitor.  Jodell Cipro

## 2015-11-22 NOTE — Consult Note (Addendum)
WOC Consult requested for ostomy assistance.  Pt has an ostomy pouch which is intact with a good seal to RLQ.  She states the ostomy has been in place for 20 years and she is independent with pouch application and emptying when at home.  She came to the hospital without supplies and uses a type and brand which is not available in the [facility]; 2 piece convex from [employer].  She states she will have her family member bring in her supplies from home and this bag was changed 2 days ago. Stoma red and viable when visualized through the pouch which has mod amt semi-formed brown stool.  Extra one piece pouch left at bedside in case leakage occurs before her supplies arrive.  Pt denies further questions or need for assistance. There is a peristomal hernia visible; discussed  possible effectes with the patient; she was not aware and asked appropriate questions. Please re-consult if further assistance is needed.  Thank-you,  Julien Girt MSN, [place], [place], [place], [place]

## 2015-11-22 NOTE — Progress Notes (Signed)
Subjective: Complains of LUQ pain and tenderness in ribs above that.   Objective: Vital signs in last 24 hours: Temp:  [98 F (36.7 C)-98.3 F (36.8 C)] 98.2 F (36.8 C) (01/25 0800) Pulse Rate:  [56] 56 (01/25 0800) Resp:  [14-20] 14 (01/25 0700) BP: (85-131)/(42-95) 102/65 mmHg (01/25 0700) SpO2:  [93 %-96 %] 96 % (01/25 0800) Weight:  [109 lb 3.2 oz (49.533 kg)] 109 lb 3.2 oz (49.533 kg) (01/25 0500) Last BM Date: 11/22/15  Intake/Output from previous day: 01/24 0701 - 01/25 0700 In: 375.9 [I.V.:375.9] Out: 400 [Urine:400] Intake/Output this shift:    Medications Scheduled Meds: . aspirin EC  81 mg Oral Daily  . atorvastatin  40 mg Oral QHS  . guaiFENesin  600 mg Oral BID  . levothyroxine  75 mcg Oral QAC breakfast  . metoprolol tartrate  50 mg Oral BID  . pantoprazole  40 mg Oral Daily  . sodium chloride  3 mL Intravenous Q12H   Continuous Infusions: . sodium chloride 1 mL/kg/hr (11/22/15 0258)  . heparin 750 Units/hr (11/22/15 0150)  . nitroGLYCERIN 5 mcg/min (11/21/15 1600)   PRN Meds:.sodium chloride, acetaminophen, haloperidol lactate, levalbuterol, morphine injection, morphine injection, nitroGLYCERIN, ondansetron (ZOFRAN) IV, promethazine, sodium chloride  PE: General appearance: alert, cooperative and no distress Lungs: clear to auscultation bilaterally Heart: regular rate and rhythm, S1, S2 normal, no murmur, click, rub or gallop Abdomen: +BS, tender in the LUQ and left ribs. Extremities: No LEE Pulses: 2+ and symmetric Skin: Warm and dry Neurologic: Grossly normal  Lab Results:   Recent Labs  11/20/15 1540 11/21/15 0220 11/22/15 0015  WBC 9.5 8.2 6.8  HGB 13.8 12.5 11.0*  HCT 41.4 37.6 33.5*  PLT 237 220 195   BMET  Recent Labs  11/20/15 1540 11/21/15 0220  NA 143 141  K 4.0 4.0  CL 103 101  CO2 25 29  GLUCOSE 95 80  BUN 7 10  CREATININE 0.71 0.80  CALCIUM 10.3 10.0   PT/INR  Recent Labs  11/21/15 0220  LABPROT 14.2    INR 1.08   Cholesterol  Recent Labs  11/21/15 0220  CHOL 155   Cardiac Panel (last 3 results)  Recent Labs  11/20/15 2222 11/21/15 0220 11/21/15 1128  TROPONINI 0.14* 0.11* 0.06*     Assessment/Plan  75 y.o. female history of hyperlipidemia, Parkinson's disease, s/p brain stimulators implantation, colon cancer in remission, thyroid disease, ongoing smoking, presents to emergency department complaining of pain "all over" that started 2 days ago and kept her awake. Today at 3 am she developed chest pain radiating to her back that started at rest but improved with sl NTG  Active Problems:  Chest pain She is tender in the ribs below her left breast.   NSTEMI (non-ST elevated myocardial infarction) (HCC) Peak troponin 0.14. Started in IV heparin and on for a left heart cath today. Also on ASA, metoprolol 50 bid(increased 1/24), NTG drip, statin. Left heart cath postponed yesterday due to severe mental status changes.  From Ambien?  She is back on the schedule for cath today.  Recommend no sedation.      Hypertension Mildly hypotensive again this morning.     LUQ abd pain  Lipase WNL two days ago.  May need abd xray or [place].  LBM today.    LOS: 2 days    HAGER, BRYAN PA-C 11/22/2015 8:49 AM   The patient was seen, examined and discussed with Tarri Fuller, PA-C and I agree  with the above.   75 year old female who presented with non-STEMI on 11/20/15, she developed mental status changes overnight, we will postpone her cardiac catheterization for today. Her BP has improved with increased metoprolol to 50 mg by mouth twice a day.  Dorothy Spark 11/22/2015

## 2015-11-22 NOTE — Progress Notes (Signed)
Pt continues to complain of constipation accompanied by abd pain, MD notified, new orders received, heat packs applied to abd, pain medication administered.  Edward Qualia RN

## 2015-11-23 DIAGNOSIS — R109 Unspecified abdominal pain: Secondary | ICD-10-CM | POA: Insufficient documentation

## 2015-11-23 DIAGNOSIS — R100 Acute abdomen: Secondary | ICD-10-CM

## 2015-11-23 LAB — BASIC METABOLIC PANEL
ANION GAP: 6 (ref 5–15)
BUN: 7 mg/dL (ref 6–20)
CO2: 27 mmol/L (ref 22–32)
Calcium: 8.4 mg/dL — ABNORMAL LOW (ref 8.9–10.3)
Chloride: 108 mmol/L (ref 101–111)
Creatinine, Ser: 0.71 mg/dL (ref 0.44–1.00)
Glucose, Bld: 82 mg/dL (ref 65–99)
POTASSIUM: 3.8 mmol/L (ref 3.5–5.1)
SODIUM: 141 mmol/L (ref 135–145)

## 2015-11-23 LAB — CBC
HCT: 31.1 % — ABNORMAL LOW (ref 36.0–46.0)
HEMOGLOBIN: 10.6 g/dL — AB (ref 12.0–15.0)
MCH: 32.9 pg (ref 26.0–34.0)
MCHC: 34.1 g/dL (ref 30.0–36.0)
MCV: 96.6 fL (ref 78.0–100.0)
PLATELETS: 150 10*3/uL (ref 150–400)
RBC: 3.22 MIL/uL — AB (ref 3.87–5.11)
RDW: 12.8 % (ref 11.5–15.5)
WBC: 8.8 10*3/uL (ref 4.0–10.5)

## 2015-11-23 MED ORDER — ASPIRIN 81 MG PO TBEC: 81.0000 mg | DELAYED_RELEASE_TABLET | Freq: Every day | ORAL | Status: AC

## 2015-11-23 MED ORDER — POLYETHYLENE GLYCOL 3350 17 G PO PACK: 17.0000 g | PACK | Freq: Every day | ORAL | Status: AC

## 2015-11-23 MED ORDER — DOCUSATE SODIUM 100 MG PO CAPS: 100.0000 mg | ORAL_CAPSULE | Freq: Two times a day (BID) | ORAL | Status: DC

## 2015-11-23 MED ORDER — DOCUSATE SODIUM 100 MG PO CAPS
100.0000 mg | ORAL_CAPSULE | Freq: Every day | ORAL | Status: DC
  Administered 2015-11-23: 100 mg via ORAL
  Filled 2015-11-23: qty 1

## 2015-11-23 MED ORDER — ISOSORBIDE MONONITRATE ER 30 MG PO TB24: 15.0000 mg | ORAL_TABLET | Freq: Every day | ORAL | Status: DC

## 2015-11-23 MED ORDER — METOPROLOL TARTRATE 25 MG PO TABS: 25.0000 mg | ORAL_TABLET | Freq: Two times a day (BID) | ORAL | Status: AC

## 2015-11-23 MED ORDER — ISOSORBIDE MONONITRATE ER 30 MG PO TB24: 15.0000 mg | ORAL_TABLET | Freq: Every day | ORAL | Status: AC

## 2015-11-23 MED ORDER — DOCUSATE SODIUM 100 MG PO CAPS: 100.0000 mg | ORAL_CAPSULE | Freq: Two times a day (BID) | ORAL | Status: AC

## 2015-11-23 NOTE — Care Management Note (Signed)
Case Management Note  Patient Details  Name: Sarah Blackburn MRN: [id number] Date of Birth: 1941/03/05  Subjective/Objective: Pt admitted for Chest Pain. Plan for d/c today with [facility].                    Action/Plan: CM did call and spoke to pt's son Sarah Blackburn and he is always at home caring for the two. Choice Offered for [facility] and the son chose [facility] for services. CM did make referral and SOC to begin within 24-48 hrs of d/c. Son to provide transportation home. Son had questions in regards to pain medications. CM did direct son to call PCP office for Rx for pain medications and refills on the other medications. No further needs from CM at this time.    Expected Discharge Date:                  Expected Discharge Plan:  [place]  In-House Referral:  NA  Discharge planning Services  CM Consult  Post Acute Care Choice:  Home Health Choice offered to:  Patient, Adult Children  DME Arranged:  N/A DME Agency:  NA  HH Arranged:  RN [place] Agency:  [facility]  Status of Service:  Completed, signed off  Medicare Important Message Given:    Date Medicare IM Given:    Medicare IM give by:    Date Additional Medicare IM Given:    Additional Medicare Important Message give by:     If discussed at [place] of Stay Meetings, dates discussed:    Additional Comments:  Sarah Roys, RN 11/23/2015, 12:06 PM

## 2015-11-23 NOTE — Progress Notes (Signed)
Patient automatic blood pressure 86/47 in left arm.  RN obtained manual blood pressure in left arm of 86/48.  Patient asymptomatic of low blood pressure at this time, denies lightheadedness and dizziness while laying in bed.  Patient does not need to urinate at this time.  RN instructed patient to call and wait for staff assistance when needing to get out of bed.  Patient stated understanding.

## 2015-11-23 NOTE — Progress Notes (Signed)
Pt discharged to home, follow up appointments explained, discharge education given, instructed Surgery center will call pt for assessment of peristomal hernia, verbal understanding expressed by pt, pt condition stable.  Edward Qualia RN

## 2015-11-23 NOTE — Consult Note (Signed)
Contacted by bedside nurse regauding ostomy supplies, see my partner's notes. Patient independent with ostomy care, uses 2pc convex at home, however we do not carry this in the hospital.  Patient to have supplies from home brought in, however today bedside nurse reports that they are going to change her pouch and that they need supplies. 1pc pouch in the room for use due to [place] does not carry 2pc convex.  Talked bedside nurse through use of 1pc and suggested patient assist as she has been independent with her ostomy care for 20 years.  [place], [place]

## 2015-11-23 NOTE — Progress Notes (Signed)
    Subjective: Complains of LLQ pain around the colostomy.   Objective: Vital signs in last 24 hours: Temp:  [97.5 F (36.4 C)-99.4 F (37.4 C)] 98.1 F (36.7 C) (01/26 0800) Pulse Rate:  [0-232] 60 (01/26 0450) Resp:  [0-104] 17 (01/26 0400) BP: (86-142)/(47-92) 86/48 mmHg (01/26 0450) SpO2:  [0 %-99 %] 95 % (01/26 0400) Weight:  [110 lb 0.2 oz (49.9 kg)] 110 lb 0.2 oz (49.9 kg) (01/26 0400) Last BM Date: 11/23/15  Intake/Output from previous day: 01/25 0701 - 01/26 0700 In: 1100 [P.O.:600; IV Piggyback:500] Out: 1250 [Urine:1250] Intake/Output this shift: Total I/O In: 240 [P.O.:240] Out: -   Medications Scheduled Meds: . aspirin EC  81 mg Oral Daily  . atorvastatin  40 mg Oral QHS  . docusate sodium  100 mg Oral BID  . guaiFENesin  600 mg Oral BID  . isosorbide mononitrate  30 mg Oral Daily  . levothyroxine  75 mcg Oral QAC breakfast  . metoprolol tartrate  37.5 mg Oral BID  . pantoprazole  40 mg Oral Daily  . polyethylene glycol  17 g Oral BID  . sodium chloride flush  3 mL Intravenous Q12H   Continuous Infusions:   PRN Meds:.sodium chloride, acetaminophen, haloperidol lactate, levalbuterol, morphine injection, nitroGLYCERIN, ondansetron (ZOFRAN) IV, promethazine, sodium chloride flush  PE: General appearance: alert, cooperative and no distress Lungs: clear to auscultation bilaterally Heart: regular rate and rhythm, S1, S2 normal, no murmur, click, rub or gallop Abdomen: +BS, tender in the LUQ and left ribs. Extremities: No LEE Pulses: 2+ and symmetric Skin: Warm and dry Neurologic: Grossly normal  Lab Results:   Recent Labs  11/21/15 0220 11/22/15 0015 11/23/15 0545  WBC 8.2 6.8 8.8  HGB 12.5 11.0* 10.6*  HCT 37.6 33.5* 31.1*  PLT 220 195 150   BMET  Recent Labs  11/20/15 1540 11/21/15 0220 11/23/15 0545  NA 143 141 141  K 4.0 4.0 3.8  CL 103 101 108  CO2 25 29 27   GLUCOSE 95 80 82  BUN 7 10 7   CREATININE 0.71 0.80 0.71  CALCIUM  10.3 10.0 8.4*   PT/INR  Recent Labs  11/21/15 0220  LABPROT 14.2  INR 1.08   Cholesterol  Recent Labs  11/21/15 0220  CHOL 155   Cardiac Panel (last 3 results)  Recent Labs  11/20/15 2222 11/21/15 0220 11/21/15 1128  TROPONINI 0.14* 0.11* 0.06*     Assessment/Plan  75 y.o. female history of hyperlipidemia, Parkinson's disease, s/p brain stimulators implantation, colon cancer in remission, thyroid disease, ongoing smoking, presents to emergency department complaining of pain "all over" that started 2 days ago and kept her awake. Today at 3 am she developed chest pain radiating to her back that started at rest but improved with sl NTG  Active Problems:   NSTEMI (non-ST elevated myocardial infarction) (HCC) Peak troponin 0.14. She underwent a left cardiac catheterization yesterday that showed non-obstructive CAD only. Suspicion for a possible spasm.  Continue metoprolol, atorvastatin, ASA, low dose imdur.   Hypertension Mildly hypotensive again this morning.Decrease imdur to 15 mg po daily.    LLQ abd pain  Lipase WNL two days ago.  May need abd xray or [place].  LBM today.   The patient can be discharged from cardiac standpoint. We will sign off, let us know if you have any questions.   Dorothy Spark, MD 11/23/2015 9:54 AM  11/23/2015
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
## 2015-11-23 NOTE — Discharge Instructions (Signed)
Abdominal Pain, Adult Many things can cause belly (abdominal) pain. Most times, the belly pain is not dangerous. Many cases of belly pain can be watched and treated at home. HOME CARE   Do not take medicines that help you go poop (laxatives) unless told to by your doctor.  Only take medicine as told by your doctor.  Eat or drink as told by your doctor. Your doctor will tell you if you should be on a special diet. GET HELP IF:  You do not know what is causing your belly pain.  You have belly pain while you are sick to your stomach (nauseous) or have runny poop (diarrhea).  You have pain while you pee or poop.  Your belly pain wakes you up at night.  You have belly pain that gets worse or better when you eat.  You have belly pain that gets worse when you eat fatty foods.  You have a fever. GET HELP RIGHT AWAY IF:   The pain does not go away within 2 hours.  You keep throwing up (vomiting).  The pain changes and is only in the right or left part of the belly.  You have bloody or tarry looking poop. MAKE SURE YOU:   Understand these instructions.  Will watch your condition.  Will get help right away if you are not doing well or get worse.   This information is not intended to replace advice given to you by your health care provider. Make sure you discuss any questions you have with your health care provider.   Document Released: 04/01/2008 Document Revised: 11/04/2014 Document Reviewed: 06/23/2013 Elsevier Interactive Patient Education 2016 [place].  Chest Pain Observation It is often hard to give a specific diagnosis for the cause of chest pain. Among other possibilities your symptoms might be caused by inadequate oxygen delivery to your heart (angina). Angina that is not treated or evaluated can lead to a heart attack (myocardial infarction) or death. Blood tests, electrocardiograms, and X-rays may have been done to help determine a possible cause of your chest  pain. After evaluation and observation, your health care provider has determined that it is unlikely your pain was caused by an unstable condition that requires hospitalization. However, a full evaluation of your pain may need to be completed, with additional diagnostic testing as directed. It is very important to keep your follow-up appointments. Not keeping your follow-up appointments could result in permanent heart damage, disability, or death. If there is any problem keeping your follow-up appointments, you must call your health care provider. HOME CARE INSTRUCTIONS  Due to the slight chance that your pain could be angina, it is important to follow your health care provider's treatment plan and also maintain a healthy lifestyle:  Maintain or work toward achieving a healthy weight.  Stay physically active and exercise regularly.  Decrease your salt intake.  Eat a balanced, healthy diet. Talk to a dietitian to learn about heart-healthy foods.  Increase your fiber intake by including whole grains, vegetables, fruits, and nuts in your diet.  Avoid situations that cause stress, anger, or depression.  Take medicines as advised by your health care provider. Report any side effects to your health care provider. Do not stop medicines or adjust the dosages on your own.  Quit smoking. Do not use nicotine patches or gum until you check with your health care provider.  Keep your blood pressure, blood sugar, and cholesterol levels within normal limits.  Limit alcohol intake to no more  than 1 drink per day for women who are not pregnant and 2 drinks per day for men.  Do not abuse drugs. SEEK IMMEDIATE MEDICAL CARE IF: You have severe chest pain or pressure which may include symptoms such as:  You feel pain or pressure in your arms, neck, jaw, or back.  You have severe back or abdominal pain, feel sick to your stomach (nauseous), or throw up (vomit).  You are sweating profusely.  You are having  a fast or irregular heartbeat.  You feel short of breath while at rest.  You notice increasing shortness of breath during rest, sleep, or with activity.  You have chest pain that does not get better after rest or after taking your usual medicine.  You wake from sleep with chest pain.  You are unable to sleep because you cannot breathe.  You develop a frequent cough or you are coughing up blood.  You feel dizzy, faint, or experience extreme fatigue.  You develop severe weakness, dizziness, fainting, or chills. Any of these symptoms may represent a serious problem that is an emergency. Do not wait to see if the symptoms will go away. Call your local emergency services (911 in the [place] Do not drive yourself to the hospital. MAKE SURE YOU:  Understand these instructions.  Will watch your condition.  Will get help right away if you are not doing well or get worse.   This information is not intended to replace advice given to you by your health care provider. Make sure you discuss any questions you have with your health care provider.   Document Released: 11/16/2010 Document Revised: 10/19/2013 Document Reviewed: 04/15/2013 Elsevier Interactive Patient Education [employer].

## 2017-09-10 ENCOUNTER — Emergency Department (HOSPITAL_COMMUNITY): Payer: Medicare Other

## 2017-09-10 ENCOUNTER — Encounter (HOSPITAL_COMMUNITY): Payer: Self-pay | Admitting: Emergency Medicine

## 2017-09-10 ENCOUNTER — Emergency Department (HOSPITAL_COMMUNITY)
Admission: EM | Admit: 2017-09-10 | Discharge: 2017-09-10 | Disposition: A | Discharge status: Home / Self Care | Payer: Medicare Other | Attending: Emergency Medicine | Admitting: Emergency Medicine

## 2017-09-10 DIAGNOSIS — R05 Cough: Secondary | ICD-10-CM | POA: Diagnosis present

## 2017-09-10 DIAGNOSIS — J181 Lobar pneumonia, unspecified organism: Secondary | ICD-10-CM | POA: Insufficient documentation

## 2017-09-10 DIAGNOSIS — Z79899 Other long term (current) drug therapy: Secondary | ICD-10-CM | POA: Diagnosis not present

## 2017-09-10 DIAGNOSIS — Z7982 Long term (current) use of aspirin: Secondary | ICD-10-CM | POA: Diagnosis not present

## 2017-09-10 DIAGNOSIS — E079 Disorder of thyroid, unspecified: Secondary | ICD-10-CM | POA: Diagnosis not present

## 2017-09-10 DIAGNOSIS — I10 Essential (primary) hypertension: Secondary | ICD-10-CM | POA: Insufficient documentation

## 2017-09-10 DIAGNOSIS — F17210 Nicotine dependence, cigarettes, uncomplicated: Secondary | ICD-10-CM | POA: Insufficient documentation

## 2017-09-10 LAB — BASIC METABOLIC PANEL
Anion gap: 9 (ref 5–15)
BUN: 15 mg/dL (ref 6–20)
CALCIUM: 10 mg/dL (ref 8.9–10.3)
CO2: 26 mmol/L (ref 22–32)
CREATININE: 0.96 mg/dL (ref 0.44–1.00)
Chloride: 104 mmol/L (ref 101–111)
GFR calc Af Amer: >60 mL/min (ref >60)
GFR, EST NON AFRICAN AMERICAN: 57 mL/min — AB (ref >60)
GLUCOSE: 98 mg/dL (ref 65–99)
Potassium: 3.8 mmol/L (ref 3.5–5.1)
Sodium: 139 mmol/L (ref 135–145)

## 2017-09-10 LAB — CBC WITH DIFFERENTIAL/PLATELET
Basophils Absolute: 0.1 10*3/uL (ref 0.0–0.1)
Basophils Relative: 0 %
EOS PCT: 1 %
Eosinophils Absolute: 0.2 10*3/uL (ref 0.0–0.7)
HEMATOCRIT: 37.1 % (ref 36.0–46.0)
Hemoglobin: 12.3 g/dL (ref 12.0–15.0)
LYMPHS PCT: 19 %
Lymphs Abs: 2.6 10*3/uL (ref 0.7–4.0)
MCH: 32.3 pg (ref 26.0–34.0)
MCHC: 33.2 g/dL (ref 30.0–36.0)
MCV: 97.4 fL (ref 78.0–100.0)
MONOS PCT: 6 %
Monocytes Absolute: 0.8 10*3/uL (ref 0.1–1.0)
NEUTROS ABS: 10 10*3/uL — AB (ref 1.7–7.7)
Neutrophils Relative %: 74 %
PLATELETS: 285 10*3/uL (ref 150–400)
RBC: 3.81 MIL/uL — ABNORMAL LOW (ref 3.87–5.11)
RDW: 13.4 % (ref 11.5–15.5)
WBC: 13.7 10*3/uL — ABNORMAL HIGH (ref 4.0–10.5)

## 2017-09-10 LAB — URINALYSIS, ROUTINE W REFLEX MICROSCOPIC
Bacteria, UA: NONE SEEN
Bilirubin Urine: NEGATIVE
GLUCOSE, UA: NEGATIVE mg/dL
HGB URINE DIPSTICK: NEGATIVE
Ketones, ur: NEGATIVE mg/dL
NITRITE: NEGATIVE
PH: 5 (ref 5.0–8.0)
Protein, ur: NEGATIVE mg/dL
SPECIFIC GRAVITY, URINE: 1.01 (ref 1.005–1.030)

## 2017-09-10 LAB — I-STAT TROPONIN, ED: Troponin i, poc: 0.01 ng/mL (ref 0.00–0.08)

## 2017-09-10 MED ORDER — AMOXICILLIN-POT CLAVULANATE 875-125 MG PO TABS
1.0000 | ORAL_TABLET | Freq: Once | ORAL | Status: AC
  Administered 2017-09-10: 1 via ORAL
  Filled 2017-09-10: qty 1

## 2017-09-10 MED ORDER — IBUPROFEN 200 MG PO TABS
400.0000 mg | ORAL_TABLET | Freq: Once | ORAL | Status: AC
  Administered 2017-09-10: 400 mg via ORAL
  Filled 2017-09-10: qty 2

## 2017-09-10 MED ORDER — AZITHROMYCIN 250 MG PO TABS: 250.0000 mg | ORAL_TABLET | Freq: Every day | ORAL | 0 refills | Status: AC

## 2017-09-10 MED ORDER — IPRATROPIUM-ALBUTEROL 0.5-2.5 (3) MG/3ML IN SOLN
3.0000 mL | Freq: Once | RESPIRATORY_TRACT | Status: AC
  Administered 2017-09-10: 3 mL via RESPIRATORY_TRACT
  Filled 2017-09-10: qty 3

## 2017-09-10 MED ORDER — AZITHROMYCIN 250 MG PO TABS
500.0000 mg | ORAL_TABLET | Freq: Once | ORAL | Status: AC
  Administered 2017-09-10: 500 mg via ORAL
  Filled 2017-09-10: qty 2

## 2017-09-10 MED ORDER — AMOXICILLIN-POT CLAVULANATE 875-125 MG PO TABS: 1.0000 | ORAL_TABLET | Freq: Two times a day (BID) | ORAL | 0 refills | Status: AC

## 2017-09-10 MED ORDER — HYDROCODONE-ACETAMINOPHEN 5-325 MG PO TABS
1.0000 | ORAL_TABLET | Freq: Once | ORAL | Status: AC
  Administered 2017-09-10: 1 via ORAL
  Filled 2017-09-10: qty 1

## 2017-09-10 NOTE — ED Notes (Signed)
Pt ambulatory and independent at discharge.  Verbalized understanding of discharge instructions 

## 2017-09-10 NOTE — Discharge Instructions (Signed)
You have a right sided pneumonia on your x-ray. You are given antibiotics for treatment, and it will also cover aspiration.  Please return without fail for worsening symptoms, including fevers, confusion, intractable vomiting, difficulty breathing, or any other symptoms concerning to you.

## 2017-09-10 NOTE — ED Triage Notes (Signed)
Pt comes from home via EMS with complaints of chest congestion for the past 2 weeks.  Hx of pneumonia. Chronic smoker.  Family states she has had recent trouble swallowing and has been aspirating on liquids. 97% on room air. Hx of parkinsons and has brain stimulator implant which can be seen in chest.  A&O to baseline.
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
## 2021-10-26 ENCOUNTER — Emergency Department (HOSPITAL_COMMUNITY): Payer: Medicare Other

## 2021-10-26 ENCOUNTER — Other Ambulatory Visit: Payer: Self-pay

## 2021-10-26 ENCOUNTER — Encounter (HOSPITAL_COMMUNITY): Payer: Self-pay | Admitting: Emergency Medicine

## 2021-10-26 ENCOUNTER — Emergency Department (HOSPITAL_COMMUNITY)
Admission: EM | Admit: 2021-10-26 | Discharge: 2021-10-26 | Disposition: A | Discharge status: Home / Self Care | Payer: Medicare Other | Attending: Emergency Medicine | Admitting: Emergency Medicine

## 2021-10-26 DIAGNOSIS — Z939 Artificial opening status, unspecified: Secondary | ICD-10-CM | POA: Insufficient documentation

## 2021-10-26 DIAGNOSIS — F17200 Nicotine dependence, unspecified, uncomplicated: Secondary | ICD-10-CM | POA: Diagnosis not present

## 2021-10-26 DIAGNOSIS — Z85038 Personal history of other malignant neoplasm of large intestine: Secondary | ICD-10-CM | POA: Diagnosis not present

## 2021-10-26 DIAGNOSIS — Z79899 Other long term (current) drug therapy: Secondary | ICD-10-CM | POA: Insufficient documentation

## 2021-10-26 DIAGNOSIS — G20 Parkinson's disease: Secondary | ICD-10-CM | POA: Insufficient documentation

## 2021-10-26 DIAGNOSIS — K5900 Constipation, unspecified: Secondary | ICD-10-CM | POA: Insufficient documentation

## 2021-10-26 DIAGNOSIS — R1084 Generalized abdominal pain: Secondary | ICD-10-CM | POA: Insufficient documentation

## 2021-10-26 DIAGNOSIS — I10 Essential (primary) hypertension: Secondary | ICD-10-CM | POA: Diagnosis not present

## 2021-10-26 DIAGNOSIS — U071 COVID-19: Secondary | ICD-10-CM | POA: Diagnosis not present

## 2021-10-26 DIAGNOSIS — Z7982 Long term (current) use of aspirin: Secondary | ICD-10-CM | POA: Insufficient documentation

## 2021-10-26 DIAGNOSIS — R112 Nausea with vomiting, unspecified: Secondary | ICD-10-CM | POA: Diagnosis present

## 2021-10-26 LAB — PROTIME-INR
INR: 0.9 (ref 0.8–1.2)
Prothrombin Time: 11.8 seconds (ref 11.4–15.2)

## 2021-10-26 LAB — URINALYSIS, ROUTINE W REFLEX MICROSCOPIC
Bilirubin Urine: NEGATIVE
Glucose, UA: NEGATIVE mg/dL
Hgb urine dipstick: NEGATIVE
Ketones, ur: NEGATIVE mg/dL
Leukocytes,Ua: NEGATIVE
Nitrite: NEGATIVE
Protein, ur: 30 mg/dL — AB
Specific Gravity, Urine: 1.03 (ref 1.005–1.030)
pH: 5 (ref 5.0–8.0)

## 2021-10-26 LAB — APTT: aPTT: 27 seconds (ref 24–36)

## 2021-10-26 LAB — COMPREHENSIVE METABOLIC PANEL
ALT: 17 U/L (ref 0–44)
AST: 18 U/L (ref 15–41)
Albumin: 4 g/dL (ref 3.5–5.0)
Alkaline Phosphatase: 78 U/L (ref 38–126)
Anion gap: 13 (ref 5–15)
BUN: 15 mg/dL (ref 8–23)
CO2: 23 mmol/L (ref 22–32)
Calcium: 10 mg/dL (ref 8.9–10.3)
Chloride: 102 mmol/L (ref 98–111)
Creatinine, Ser: 1.22 mg/dL — ABNORMAL HIGH (ref 0.44–1.00)
GFR, Estimated: 45 mL/min — ABNORMAL LOW (ref >60)
Glucose, Bld: 123 mg/dL — ABNORMAL HIGH (ref 70–99)
Potassium: 4.4 mmol/L (ref 3.5–5.1)
Sodium: 138 mmol/L (ref 135–145)
Total Bilirubin: 0.6 mg/dL (ref 0.3–1.2)
Total Protein: 7.1 g/dL (ref 6.5–8.1)

## 2021-10-26 LAB — CBC
HCT: 45 % (ref 36.0–46.0)
Hemoglobin: 14.9 g/dL (ref 12.0–15.0)
MCH: 33 pg (ref 26.0–34.0)
MCHC: 33.1 g/dL (ref 30.0–36.0)
MCV: 99.8 fL (ref 80.0–100.0)
Platelets: 330 10*3/uL (ref 150–400)
RBC: 4.51 MIL/uL (ref 3.87–5.11)
RDW: 12.8 % (ref 11.5–15.5)
WBC: 19 10*3/uL — ABNORMAL HIGH (ref 4.0–10.5)
nRBC: 0 % (ref 0.0–0.2)

## 2021-10-26 LAB — RESP PANEL BY RT-PCR (FLU A&B, COVID) ARPGX2
Influenza A by PCR: NEGATIVE
Influenza B by PCR: NEGATIVE
SARS Coronavirus 2 by RT PCR: POSITIVE — AB

## 2021-10-26 LAB — LIPASE, BLOOD: Lipase: 52 U/L — ABNORMAL HIGH (ref 11–51)

## 2021-10-26 LAB — LACTIC ACID, PLASMA
Lactic Acid, Venous: 2.3 mmol/L (ref 0.5–1.9)
Lactic Acid, Venous: 1.7 mmol/L (ref 0.5–1.9)

## 2021-10-26 MED ORDER — FENTANYL CITRATE PF 50 MCG/ML IJ SOSY
25.0000 ug | PREFILLED_SYRINGE | Freq: Once | INTRAMUSCULAR | Status: AC
  Administered 2021-10-26: 13:00:00 25 ug via INTRAVENOUS
  Filled 2021-10-26: qty 1

## 2021-10-26 MED ORDER — IBUPROFEN 400 MG PO TABS
400.0000 mg | ORAL_TABLET | Freq: Once | ORAL | Status: AC
  Administered 2021-10-26: 19:00:00 400 mg via ORAL
  Filled 2021-10-26: qty 1

## 2021-10-26 MED ORDER — PIPERACILLIN-TAZOBACTAM 3.375 G IVPB 30 MIN
3.3750 g | Freq: Once | INTRAVENOUS | Status: AC
  Administered 2021-10-26: 14:00:00 3.375 g via INTRAVENOUS
  Filled 2021-10-26: qty 50

## 2021-10-26 MED ORDER — PROCHLORPERAZINE MALEATE 5 MG PO TABS
10.0000 mg | ORAL_TABLET | Freq: Once | ORAL | Status: AC
  Administered 2021-10-26: 19:00:00 10 mg via ORAL
  Filled 2021-10-26: qty 2

## 2021-10-26 MED ORDER — LACTATED RINGERS IV BOLUS
1000.0000 mL | Freq: Once | INTRAVENOUS | Status: AC
  Administered 2021-10-26: 13:00:00 1000 mL via INTRAVENOUS

## 2021-10-26 MED ORDER — IOHEXOL 350 MG/ML SOLN
75.0000 mL | Freq: Once | INTRAVENOUS | Status: AC | PRN
  Administered 2021-10-26: 15:00:00 75 mL via INTRAVENOUS

## 2021-10-26 MED ORDER — ONDANSETRON HCL 4 MG PO TABS: 4.0000 mg | ORAL_TABLET | Freq: Four times a day (QID) | ORAL | 0 refills | Status: AC

## 2021-10-26 MED ORDER — ONDANSETRON HCL 4 MG/2ML IJ SOLN
4.0000 mg | Freq: Once | INTRAMUSCULAR | Status: AC
  Administered 2021-10-26: 13:00:00 4 mg via INTRAVENOUS
  Filled 2021-10-26: qty 2

## 2021-10-26 NOTE — ED Notes (Signed)
Discharge instructions reviewed with patient and family . Patient and family verbalized understanding of instructions. Follow-up care and medications were reviewed. Patient wheeled out of ED in wheelchair. VSS upon discharge.

## 2021-10-26 NOTE — Progress Notes (Signed)
Notified bedside nurse of need to draw lactic acid.  

## 2021-10-26 NOTE — Discharge Instructions (Addendum)
You were seen in the emergency department today for abdominal pain.  As we discussed your lab work and your imaging was not consistent with a bowel obstruction.  However it did clue [place] into to an infection, and you tested positive for COVID-19.  I think this was the cause of your belly pain, and your vomiting.    I am glad that we have had output from her ostomy since then.  I would like you to make your primary doctor aware of your symptoms today.  I am prescribing you nausea medicine that you can take at home, you can take 1 tablet every 6 hours as needed for nausea.  This should help you keep down your other medications and food.  Please use Tylenol or ibuprofen for pain.  You may use 600 mg ibuprofen every 6 hours or 1000 mg of Tylenol every 6 hours.  You may choose to alternate between the 2.  This would be most effective.  Do not exceed 4 g of Tylenol within 24 hours.  Do not exceed 3200 mg ibuprofen within 24 hours.   Continue to monitor how you're doing and return to the ER for new or worsening symptoms such as fever despite medication, decreased ostomy output.   It has been a pleasure seeing and caring for you today and I hope you start feeling better soon!

## 2021-10-26 NOTE — ED Provider Notes (Signed)
[facility] EMERGENCY DEPARTMENT Provider Note   CSN: [id number] Arrival date & time: 10/26/21  8299     History Chief Complaint  Patient presents with   Abdominal Pain    Sarah Blackburn is a 80 y.o. female history of colon cancer s/p ostomy and Parkinson's disease who presents to the emergency department complaining of abdominal pain and constipation.  Patient states she has been having symptoms for the past 3 days, and has had decreasing output from her ostomy.  At this point she has no output from her ostomy, and is not passing gas.  She also reports nausea and vomiting since yesterday.  She was initially seen by EMS yesterday but refused transport at that time.   Abdominal Pain Associated symptoms: constipation, nausea and vomiting   Associated symptoms: no chest pain, no chills, no diarrhea, no dysuria, no fever and no shortness of breath       Past Medical History:  Diagnosis Date   Colon cancer ([place])    Hypertension    Parkinson's disease    Thyroid disease     Patient Active Problem List   Diagnosis Date Noted   Abdominal pain, acute    Parkinson disease ([place])    Acute delirium    Chest pain 11/20/2015   Elevated troponin 11/20/2015   NSTEMI (non-ST elevated myocardial infarction) ([place]) 11/20/2015   Hypertension 11/20/2015   Pain in the chest    Generalized pain     Past Surgical History:  Procedure Laterality Date   ABDOMINAL HYSTERECTOMY     BRAIN SURGERY     CARDIAC CATHETERIZATION N/A 11/22/2015   Procedure: Left Heart Cath and Coronary Angiography;  Surgeon: Wellington Hampshire, MD;  Location: [facility] [facility];  Service: Cardiovascular;  Laterality: N/A;   COLON SURGERY       OB History   No obstetric history on file.     Family History  Problem Relation Age of Onset   Diabetes type II Mother    CAD Father     Social History   Tobacco Use   Smoking status: Every Day  Substance Use Topics   Alcohol use: Yes    Comment:  occasional wine   Drug use: Yes    Types: Marijuana    Home Medications Prior to Admission medications   Medication Sig Start Date End Date Taking? Authorizing Provider  acetaminophen (TYLENOL) 500 MG tablet Take 1,000 mg by mouth every 6 (six) hours as needed for moderate pain.   Yes [provider]  albuterol (VENTOLIN HFA) 108 (90 Base) MCG/ACT inhaler Inhale 2 puffs into the lungs every 6 (six) hours as needed for wheezing. 08/23/21  Yes [provider]  alendronate (FOSAMAX) 70 MG tablet Take 70 mg by mouth once a week. 11/18/15  Yes [provider]  ALPRAZolam Duanne Moron) 0.25 MG tablet Take 0.25 mg by mouth daily as needed for anxiety. 09/14/21  Yes [provider]  Ascorbic Acid (VITAMIN C) 100 MG tablet Take 100 mg daily by mouth.   Yes [provider]  aspirin EC 81 MG EC tablet Take 1 tablet (81 mg total) by mouth daily. 11/23/15  Yes Barton Dubois, MD  atorvastatin (LIPITOR) 20 MG tablet Take 20 mg by mouth daily. 08/23/21  Yes [provider]  bisacodyl (DULCOLAX) 5 MG EC tablet Take 5 mg by mouth daily.   Yes [provider]  cholecalciferol (VITAMIN D) 1000 UNITS tablet Take 1,000 Units by mouth daily.  Yes [provider]  cyanocobalamin 1000 MCG tablet Take 1,000 mcg by mouth daily. 01/05/20  Yes [provider]  fluticasone (FLONASE) 50 MCG/ACT nasal spray Place 1 spray into the nose daily as needed for allergies. 09/02/19  Yes [provider]  gabapentin (NEURONTIN) 400 MG capsule Take 800 mg by mouth 2 (two) times daily. 09/25/21  Yes [provider]  levothyroxine (SYNTHROID, LEVOTHROID) 75 MCG tablet Take 75 mcg by mouth daily.   Yes [provider]  meloxicam (MOBIC) 7.5 MG tablet Take 7.5 mg by mouth daily as needed for pain. 09/28/21  Yes [provider]  ondansetron (ZOFRAN) 4 MG tablet Take 1 tablet (4 mg total) by mouth every 6 (six) hours. 10/26/21  Yes  Kieron Kantner T, PA-C  sulfamethoxazole-trimethoprim (BACTRIM DS) 800-160 MG tablet Take 1 tablet by mouth 2 (two) times daily. continuous   Yes [provider]  amoxicillin-clavulanate (AUGMENTIN) 875-125 MG tablet Take 1 tablet every 12 (twelve) hours by mouth. Patient not taking: Reported on 10/26/2021 09/10/17   Forde Dandy, MD  azithromycin (ZITHROMAX) 250 MG tablet Take 1 tablet (250 mg total) daily by mouth. Patient not taking: Reported on 10/26/2021 09/11/17   Forde Dandy, MD  docusate sodium (COLACE) 100 MG capsule Take 1 capsule (100 mg total) by mouth 2 (two) times daily. Patient not taking: Reported on 09/10/2017 11/23/15   Barton Dubois, MD  isosorbide mononitrate (IMDUR) 30 MG 24 hr tablet Take 0.5 tablets (15 mg total) by mouth daily. Patient not taking: Reported on 09/10/2017 11/24/15   Barton Dubois, MD  Metoprolol Tartrate (LOPRESSOR) 25 MG tablet Take 1 tablet (25 mg total) by mouth 2 (two) times daily. Patient not taking: Reported on 09/10/2017 11/23/15   Barton Dubois, MD  polyethylene glycol [facility] / Floria Raveling) packet Take 17 g by mouth daily. Patient not taking: Reported on 09/10/2017 11/23/15   Barton Dubois, MD    Allergies    Patient has no known allergies.  Review of Systems   Review of Systems  Constitutional:  Negative for chills and fever.  Respiratory:  Negative for shortness of breath.   Cardiovascular:  Negative for chest pain.  Gastrointestinal:  Positive for abdominal pain, constipation, nausea and vomiting. Negative for diarrhea.  Genitourinary:  Negative for dysuria and flank pain.  Musculoskeletal:  Negative for back pain.  Neurological:  Negative for dizziness and headaches.  All other systems reviewed and are negative.  Physical Exam Updated Vital Signs BP 130/71    Pulse 76    Temp 98.2 F (36.8 C) (Oral)    Resp 18    Ht 5\' 1"  (1.549 m)    Wt 54.4 kg    SpO2 95%    BMI 22.67 kg/m   Physical Exam Vitals and nursing note  reviewed.  Constitutional:      Appearance: Normal appearance.  HENT:     Head: Normocephalic and atraumatic.  Eyes:     Conjunctiva/sclera: Conjunctivae normal.  Cardiovascular:     Rate and Rhythm: Normal rate and regular rhythm.  Pulmonary:     Effort: Pulmonary effort is normal. No respiratory distress.     Breath sounds: Normal breath sounds.  Abdominal:     General: A surgical scar is present. There is distension.     Palpations: Abdomen is soft.     Tenderness: There is generalized abdominal tenderness and tenderness in the right upper quadrant and right lower quadrant. There is guarding. There is no right CVA  tenderness, left CVA tenderness or rebound.     Comments: Patient has generalized abdominal tenderness to palpation, worse over the ostomy site on the right side of the abdomen  Skin:    General: Skin is warm and dry.  Neurological:     General: No focal deficit present.     Mental Status: She is alert.    ED Results / Procedures / Treatments   Labs (all labs ordered are listed, but only abnormal results are displayed) Labs Reviewed  RESP PANEL BY RT-PCR (FLU A&B, COVID) ARPGX2 - Abnormal; Notable for the following components:      Result Value   SARS Coronavirus 2 by RT PCR POSITIVE (*)    All other components within normal limits  LIPASE, BLOOD - Abnormal; Notable for the following components:   Lipase 52 (*)    All other components within normal limits  COMPREHENSIVE METABOLIC PANEL - Abnormal; Notable for the following components:   Glucose, Bld 123 (*)    Creatinine, Ser 1.22 (*)    GFR, Estimated 45 (*)    All other components within normal limits  CBC - Abnormal; Notable for the following components:   WBC 19.0 (*)    All other components within normal limits  URINALYSIS, ROUTINE W REFLEX MICROSCOPIC - Abnormal; Notable for the following components:   Color, Urine AMBER (*)    APPearance HAZY (*)    Protein, ur 30 (*)    Bacteria, UA FEW (*)    All  other components within normal limits  LACTIC ACID, PLASMA - Abnormal; Notable for the following components:   Lactic Acid, Venous 2.3 (*)    All other components within normal limits  CULTURE, BLOOD (ROUTINE X 2)  CULTURE, BLOOD (ROUTINE X 2)  PROTIME-INR  APTT  LACTIC ACID, PLASMA    EKG EKG Interpretation  Date/Time:  Friday October 26 2021 13:24:56 EST Ventricular Rate:  91 PR Interval:  150 QRS Duration: 90 QT Interval:  362 QTC Calculation: 446 R Axis:   -33 Text Interpretation: Sinus rhythm Left axis deviation Abnormal R-wave progression, late transition Confirmed by Regan Lemming ([id number]) on 10/26/2021 1:52:36 PM  Radiology DG Abdomen 1 View  Result Date: 10/26/2021 CLINICAL DATA:  Abdominal pain EXAM: ABDOMEN - 1 VIEW COMPARISON:  CT from the same day FINDINGS: Residual contrast material in the renal collecting systems and urinary bladder. Stomach appears decompressed. A few gas filled nondilated bowel loops in the lower abdomen. No dilated bowel is identified. Mild lumbar levoscoliosis. No abnormal abdominal calcifications. IMPRESSION: Nonobstructive bowel gas pattern Electronically Signed   By: Lucrezia Europe M.D.   On: 10/26/2021 16:22   CT ABDOMEN PELVIS W CONTRAST  Result Date: 10/26/2021 CLINICAL DATA:  Bowel obstruction suspected. Abdominal pain. History of colon cancer with colon resection. EXAM: CT ABDOMEN AND PELVIS WITH CONTRAST TECHNIQUE: Multidetector CT imaging of the abdomen and pelvis was performed using the standard protocol following bolus administration of intravenous contrast. CONTRAST:  30mL OMNIPAQUE IOHEXOL 350 MG/ML SOLN COMPARISON:  CT enterography 06/02/2015. FINDINGS: Lower chest: There is atelectasis or scarring in the left lung base. Hepatobiliary: The gallbladder is surgically absent. Common bile duct measures 9 mm, unchanged from the prior examination. No focal liver lesions are identified. Pancreas: Unremarkable. No pancreatic ductal dilatation or  surrounding inflammatory changes. Spleen: Normal in size without focal abnormality. Adrenals/Urinary Tract: There is some new cortical thinning/scarring in the left kidney. There is no hydronephrosis or perinephric fluid. There is a punctate nonobstructing left  renal calculus. The adrenal glands, right kidney and bladder are within normal limits. Stomach/Bowel: Patient is status post partial colectomy with right lower quadrant colostomy. Small bowel loops in the lower abdomen are diffusely fluid-filled with air-fluid levels, although they are not dilated. There is no wall thickening or mesenteric edema. There is no pneumatosis. Proximal small bowel and stomach are within normal limits. There is a questionable focal area of wall thickening and enhancement in the ascending colon in the right hemipelvis image 3/58. The colon is otherwise within normal limits. Multiple small bowel loops approximate the anterior abdominal wall, similar to the prior study. Vascular/Lymphatic: Aortic atherosclerosis. No enlarged abdominal or pelvic lymph nodes. Reproductive: Status post hysterectomy. No adnexal masses. Other: There is a small left-sided abdominal wall hernia containing fluid and nondilated small bowel. This is similar to the prior study. There is no ascites. There is mild presacral edema, new from prior. Multiple metallic foreign bodies are again seen in the left abdominal wall likely related to old trauma. Intramuscular lipoma within left hip musculature appears unchanged. Musculoskeletal: No acute or significant osseous findings. IMPRESSION: 1. Air-fluid levels throughout small bowel loops in the lower abdomen and pelvis. Findings are nonspecific, but can be seen in the setting of nonspecific enteritis. No bowel obstruction. 2. Right lower quadrant colostomy. Can not exclude focal wall thickening and enhancement of the ascending colon versus normal under distension. Recommend clinical correlation and follow-up to exclude  neoplasm. 3. New mild presacral edema. 4. Nonobstructing left renal calculus. Left renal cortical scarring. 5.  Aortic Atherosclerosis (ICD10-I70.0). Electronically Signed   By: Ronney Asters M.D.   On: 10/26/2021 15:32   DG Chest Port 1 View  Result Date: 10/26/2021 CLINICAL DATA:  Abdominal pain, constipation, nausea, vomiting, chest pain EXAM: PORTABLE CHEST 1 VIEW COMPARISON:  Chest radiograph 09/10/2017 FINDINGS: Bilateral stimulator devices obscure large portions of lungs bilaterally. The cardiomediastinal silhouette is stable. There is no focal consolidation or pulmonary edema. There is no pleural effusion or pneumothorax. There is no acute osseous abnormality. IMPRESSION: No radiographic evidence of acute cardiopulmonary process, though portions of the lungs are obscured by bilateral stimulator devices. Electronically Signed   By: Valetta Mole M.D.   On: 10/26/2021 13:10    Procedures Procedures   Medications Ordered in ED Medications  lactated ringers bolus 1,000 mL (0 mLs Intravenous Stopped 10/26/21 1856)  fentaNYL (SUBLIMAZE) injection 25 mcg (25 mcg Intravenous Given 10/26/21 1322)  ondansetron (ZOFRAN) injection 4 mg (4 mg Intravenous Given 10/26/21 1321)  piperacillin-tazobactam (ZOSYN) IVPB 3.375 g (0 g Intravenous Stopped 10/26/21 1856)  iohexol (OMNIPAQUE) 350 MG/ML injection 75 mL (75 mLs Intravenous Contrast Given 10/26/21 1517)  prochlorperazine (COMPAZINE) tablet 10 mg (10 mg Oral Given 10/26/21 1855)  ibuprofen (ADVIL) tablet 400 mg (400 mg Oral Given 10/26/21 1917)    ED Course  I have reviewed the triage vital signs and the nursing notes.  Pertinent labs & imaging results that were available during my care of the patient were reviewed by me and considered in my medical decision making (see chart for details).    MDM Rules/Calculators/A&P                          Patient is a 80 year old female with a history of colon cancer and Parkinson's disease who presents  to the emergency department for decreased ostomy output for 3 days.  On my exam patient has generalized tenderness to palpation of her abdomen, worse  in the right upper and lower quadrants over the ostomy site.  There is mild guarding, no rebound.  She is afebrile, tachycardic to 105, not hypoxic.  1:40pm Lactic acid noted to be 2.3. In the setting of tachycardia, leukocytosis of 19,000 and increased lactic acid, will start patient on empiric ABX while awaiting CT scan.   3:32pm CT scan showed no bowel obstruction. Pattern consistent with enteritis. On re-evaluation patient has had decent output from her ostomy, and ostomy bag was changed without difficulty. Patient tested positive for COVID-19. I think her abdominal pain and nausea were likely coming from this. CXR negative for pneumonia.   7:30pm Patient successfully passed PO challenge and remained hemodynamically stable and afebrile. Abdominal exam appears improved and is non-surgical. Her pain is controlled with PO medications. She is not requiring admission or inpatient treatment. Will discharge to home and give close return precautions. Patient and family member agreeable to plan.   I discussed this case with my attending physician Dr. Armandina Gemma who cosigned this note including patient's presenting symptoms, physical exam, and planned diagnostics and interventions. Attending physician stated agreement with plan or made changes to plan which were implemented.    Final Clinical Impression(s) / ED Diagnoses Final diagnoses:  Generalized abdominal pain  COVID-19  History of creation of ostomy [facility])    Rx / DC Orders ED Discharge Orders          Ordered    ondansetron (ZOFRAN) 4 MG tablet  Every 6 hours        10/26/21 1918           Portions of this report may have been transcribed using voice recognition software. Every effort was made to ensure accuracy; however, inadvertent computerized transcription errors may be present.     Estill Cotta 10/26/21 1932    Regan Lemming, MD 10/26/21 2023

## 2021-10-26 NOTE — Progress Notes (Signed)
Elink following for sepsis protocol. 

## 2021-10-26 NOTE — ED Provider Notes (Signed)
Emergency Medicine Provider Triage Evaluation Note  Sarah Blackburn , a 80 y.o. female  was evaluated in triage.  Pt complains of abdominal pain, constipation, and nausea/vomiting since yesterday. Coming from home. Has ostomy bag, reports low output. Tried taking medication yesterday for constipation that has not helped. Called EMS yesterday but refused transport at that time. States her sx have not gotten any better.  Review of Systems  Positive: Abdominal pain, nausea, vomiting, constipation Negative: Fever, chills, diarrhea  Physical Exam  BP 115/71 (BP Location: Right Arm)    Pulse (!) 107    Temp 98.2 F (36.8 C) (Oral)    Resp 18    Ht 5\' 1"  (1.549 m)    Wt 54.4 kg    SpO2 96%    BMI 22.67 kg/m  Gen:   Awake, no distress   Resp:  Normal effort  MSK:   Moves extremities without difficulty  Other:    Medical Decision Making  Medically screening exam initiated at 9:37 AM.  Appropriate orders placed.  Sarah Blackburn was informed that the remainder of the evaluation will be completed by another provider, this initial triage assessment does not replace that evaluation, and the importance of remaining in the ED until their evaluation is complete.  Son is requesting call with update once patient is roomed. Phone number in his chart.    Sarah Blackburn 10/26/21 0174    Regan Lemming, MD 10/26/21 1224

## 2021-10-26 NOTE — ED Triage Notes (Signed)
Pt arrives via EMS from home with abdominal pain at colostomy site with no output from colostomy for the last 3 days. Pt reports began vomiting last night and today. Pt awake, alert, appropriate. No recent fevers. VSS.

## 2021-10-31 LAB — CULTURE, BLOOD (ROUTINE X 2)
Culture: NO GROWTH
Culture: NO GROWTH
Special Requests: ADEQUATE
Special Requests: ADEQUATE

## 2022-03-15 ENCOUNTER — Emergency Department (HOSPITAL_COMMUNITY)
Admission: EM | Admit: 2022-03-15 | Discharge: 2022-03-15 | Disposition: A | Discharge status: Home / Self Care | Payer: 59 | Attending: Emergency Medicine | Admitting: Emergency Medicine

## 2022-03-15 ENCOUNTER — Encounter (HOSPITAL_COMMUNITY): Payer: Self-pay | Admitting: Emergency Medicine

## 2022-03-15 ENCOUNTER — Emergency Department (HOSPITAL_COMMUNITY): Payer: 59

## 2022-03-15 DIAGNOSIS — I10 Essential (primary) hypertension: Secondary | ICD-10-CM | POA: Diagnosis not present

## 2022-03-15 DIAGNOSIS — R079 Chest pain, unspecified: Secondary | ICD-10-CM | POA: Diagnosis present

## 2022-03-15 DIAGNOSIS — G20 Parkinson's disease: Secondary | ICD-10-CM | POA: Insufficient documentation

## 2022-03-15 DIAGNOSIS — F17200 Nicotine dependence, unspecified, uncomplicated: Secondary | ICD-10-CM | POA: Insufficient documentation

## 2022-03-15 DIAGNOSIS — Z85038 Personal history of other malignant neoplasm of large intestine: Secondary | ICD-10-CM | POA: Diagnosis not present

## 2022-03-15 LAB — CBC
HCT: 38.9 % (ref 36.0–46.0)
Hemoglobin: 13.1 g/dL (ref 12.0–15.0)
MCH: 33 pg (ref 26.0–34.0)
MCHC: 33.7 g/dL (ref 30.0–36.0)
MCV: 98 fL (ref 80.0–100.0)
Platelets: 195 10*3/uL (ref 150–400)
RBC: 3.97 MIL/uL (ref 3.87–5.11)
RDW: 12.3 % (ref 11.5–15.5)
WBC: 8.9 10*3/uL (ref 4.0–10.5)
nRBC: 0 % (ref 0.0–0.2)

## 2022-03-15 LAB — TROPONIN I (HIGH SENSITIVITY)
Troponin I (High Sensitivity): 6 ng/L (ref <18)
Troponin I (High Sensitivity): 6 ng/L (ref <18)

## 2022-03-15 LAB — BASIC METABOLIC PANEL
Anion gap: 7 (ref 5–15)
BUN: 13 mg/dL (ref 8–23)
CO2: 27 mmol/L (ref 22–32)
Calcium: 8.9 mg/dL (ref 8.9–10.3)
Chloride: 107 mmol/L (ref 98–111)
Creatinine, Ser: 0.78 mg/dL (ref 0.44–1.00)
GFR, Estimated: >60 mL/min (ref >60)
Glucose, Bld: 110 mg/dL — ABNORMAL HIGH (ref 70–99)
Potassium: 4.4 mmol/L (ref 3.5–5.1)
Sodium: 141 mmol/L (ref 135–145)

## 2022-03-15 NOTE — ED Provider Notes (Signed)
Patient seen after prior EDP.  Patient has been comfortable for the entirety of her ED evaluation.  Obtained EKG is without clear evidence of ischemia.  Described history is not consistent with likely ACS.  Troponin x2 is 6 with a repeat of 6.  Patient desires discharge home.  She does understand need for close outpatient follow-up both with her PCP and also with cardiology  Strict return precautions given and understood.   Valarie Merino, MD 03/15/22 [phone]

## 2022-03-15 NOTE — ED Triage Notes (Signed)
Patient BIB [facility] c/o chest pain all night, worsening at 0300.  Pain is centralized chest, worse with inspiration.  EMS gave 324 mg asa, 1 nitro.  128/70 58 HR 96% RA 114 CBG RR 20

## 2022-03-15 NOTE — ED Notes (Signed)
RN explained to son, Toula Moos the discharge instructions. Toula Moos lives with pt at home. [facility] is picking up pt.

## 2022-03-15 NOTE — ED Provider Notes (Signed)
[facility] Emergency Department Provider Note MRN:  [id number]  Arrival date & time: 03/15/22     Chief Complaint   Chest Pain   History of Present Illness   Sarah Blackburn is a 81 y.o. year-old female with a history of colon cancer, hypertension and Parkinson's disease presenting to the ED with chief complaint of chest pain.  Sudden onset chest pain with nausea, vomiting this morning, now resolved.  No shortness of breath, no leg pain or swelling.  Review of Systems  A thorough review of systems was obtained and all systems are negative except as noted in the HPI and PMH.   Patient's Health History    Past Medical History:  Diagnosis Date   Colon cancer ([place])    Hypertension    Parkinson's disease    Thyroid disease     Past Surgical History:  Procedure Laterality Date   ABDOMINAL HYSTERECTOMY     BRAIN SURGERY     CARDIAC CATHETERIZATION N/A 11/22/2015   Procedure: Left Heart Cath and Coronary Angiography;  Surgeon: Wellington Hampshire, MD;  Location: [place] [facility];  Service: Cardiovascular;  Laterality: N/A;   COLON SURGERY      Family History  Problem Relation Age of Onset   Diabetes type II Mother    CAD Father     Social History   Socioeconomic History   Marital status: Divorced    Spouse name: Not on file   Number of children: Not on file   Years of education: Not on file   Highest education level: Not on file  Occupational History   Not on file  Tobacco Use   Smoking status: Every Day   Smokeless tobacco: Not on file  Substance and Sexual Activity   Alcohol use: Yes    Comment: occasional wine   Drug use: Yes    Types: Marijuana   Sexual activity: Not on file  Other Topics Concern   Not on file  Social History Narrative   Not on file   Social Determinants of Health   Financial Resource Strain: Not on file  Food Insecurity: Not on file  Transportation Needs: Not on file  Physical Activity: Not on file  Stress: Not  on file  Social Connections: Not on file  Intimate Partner Violence: Not on file     Physical Exam   Vitals:   03/15/22 0545 03/15/22 0615  BP: (!) 142/52 (!) 144/49  Pulse: (!) 53 (!) 51  Resp: 19 19  Temp: 98 F (36.7 C)   SpO2: 96% 97%    CONSTITUTIONAL: Chronically ill-appearing, NAD NEURO/PSYCH:  Alert and oriented x 3, no focal deficits EYES:  eyes equal and reactive ENT/NECK:  no LAD, no JVD CARDIO: Regular rate, well-perfused, normal S1 and S2 PULM:  CTAB no wheezing or rhonchi GI/GU:  non-distended, non-tender MSK/SPINE:  No gross deformities, no edema SKIN:  no rash, atraumatic   *Additional and/or pertinent findings included in MDM below  Diagnostic and Interventional Summary    EKG Interpretation  Date/Time:  Mar 15, 2022 at 5: 42: 00 Ventricular Rate:  52 PR Interval:    QRS Duration: 82 QT Interval:  431 QTC Calculation:   R Axis:     Text Interpretation: Diffuse artifact, appears to be sinus rhythm Confirm with Dr. Gerlene Fee 6:33 AM       Labs Reviewed  BASIC METABOLIC PANEL - Abnormal; Notable for the following components:      Result  Value   Glucose, Bld 110 (*)    All other components within normal limits  CBC  TROPONIN I (HIGH SENSITIVITY)    DG Chest 2 View  Final Result      Medications - No data to display   Procedures  /  Critical Care Procedures  ED Course and Medical Decision Making  Initial Impression and Ddx History of CAD presenting with sudden onset chest pain, with nausea, vomiting.  Pain has resolved.  Sitting comfortably.  Normal vital signs, doubt PE or dissection.  No cough or fever to suggest pneumonia.  EKG very difficult to interpret due to patient's brain stimulator is located on her chest.  Awaiting troponin x2.  Past medical/surgical history that increases complexity of ED encounter: CAD, Parkinson's  Interpretation of Diagnostics I personally reviewed the EKG and my interpretation is as follows: Diffuse  artifact    Labs pending  Patient Reassessment and Ultimate Disposition/Management Signed out to oncoming provider.  Patient management required discussion with the following services or consulting groups:  None  Complexity of Problems Addressed Acute illness or injury that poses threat of life of bodily function  Additional Data Reviewed and Analyzed Further history obtained from: None  Additional Factors Impacting ED Encounter Risk Consideration of hospitalization  Barth Kirks. Sedonia Small, [place] [email or web address]  Final Clinical Impressions(s) / ED Diagnoses     ICD-10-CM   1. Chest pain, unspecified type  R07.9       ED Discharge Orders     None        Discharge Instructions Discussed with and Provided to Patient:   Discharge Instructions   None      Maudie Flakes, MD 03/15/22 [phone]

## 2022-03-15 NOTE — Discharge Instructions (Signed)
Return for any problem.  ?
